# Patient Record
Sex: Male | Born: 1950 | Race: Black or African American | Hispanic: No | Marital: Single | Smoking: Never smoker
Health system: Southern US, Community
[De-identification: ages and names within clinical notes are randomized; demographics above are authoritative.]

## PROBLEM LIST (undated history)

## (undated) DIAGNOSIS — K219 Gastro-esophageal reflux disease without esophagitis: Secondary | ICD-10-CM

## (undated) DIAGNOSIS — I10 Essential (primary) hypertension: Secondary | ICD-10-CM

## (undated) DIAGNOSIS — M1990 Unspecified osteoarthritis, unspecified site: Secondary | ICD-10-CM

## (undated) DIAGNOSIS — E039 Hypothyroidism, unspecified: Secondary | ICD-10-CM

---

## 2015-06-26 HISTORY — PX: BICEPS TENDON REPAIR: SHX566

## 2017-05-16 NOTE — Progress Notes (Signed)
05-03-17 Surgical Clearance from Dr. Dorna LeitzMilam, EKG, CBC, CMP on chart

## 2017-05-17 ENCOUNTER — Other Ambulatory Visit: Payer: Self-pay

## 2017-05-17 ENCOUNTER — Encounter (HOSPITAL_COMMUNITY): Payer: Self-pay

## 2017-05-17 ENCOUNTER — Encounter (HOSPITAL_COMMUNITY)
Admission: RE | Admit: 2017-05-17 | Discharge: 2017-05-17 | Disposition: A | Discharge status: Home / Self Care | Payer: BLUE CROSS/BLUE SHIELD | Source: Ambulatory Visit | Attending: Orthopedic Surgery | Admitting: Orthopedic Surgery

## 2017-05-17 DIAGNOSIS — Z01818 Encounter for other preprocedural examination: Secondary | ICD-10-CM | POA: Insufficient documentation

## 2017-05-17 HISTORY — DX: Gastro-esophageal reflux disease without esophagitis: K21.9

## 2017-05-17 HISTORY — DX: Hypothyroidism, unspecified: E03.9

## 2017-05-17 HISTORY — DX: Unspecified osteoarthritis, unspecified site: M19.90

## 2017-05-17 HISTORY — DX: Essential (primary) hypertension: I10

## 2017-05-17 LAB — SURGICAL PCR SCREEN
MRSA, PCR: NEGATIVE
Staphylococcus aureus: NEGATIVE

## 2017-05-17 NOTE — H&P (Signed)
TOTAL KNEE ADMISSION H&P  Patient is being admitted for bilateral total knee arthroplasties.  Subjective:  Chief Complaint:    Bilateral knee primary OA / pain  HPI: Ethan Patrick., 67 y.o. male, has a history of pain and functional disability in the bilateral knee due to arthritis and has failed non-surgical conservative treatments for greater than 12 weeks to include NSAID's and/or analgesics, corticosteriod injections, viscosupplementation injections and activity modification.  Onset of symptoms was gradual, starting >10 years ago with gradually worsening course since that time. The patient noted no past surgery on the bilateral knees.  Patient currently rates pain in the bilateral knees at 8 out of 10 with activity. Patient has night pain, worsening of pain with activity and weight bearing, pain that interferes with activities of daily living, pain with passive range of motion, crepitus and joint swelling.  Patient has evidence of periarticular osteophytes and joint space narrowing by imaging studies.  There is no active infection.  Risks, benefits and expectations were discussed with the patient.  Risks including but not limited to the risk of anesthesia, blood clots, nerve damage, blood vessel damage, failure of the prosthesis, infection and up to and including death.  Patient understand the risks, benefits and expectations and wishes to proceed with surgery.   PCP: Zachery Dauer, MD  D/C Plans:       Home  Post-op Meds:       No Rx given  Tranexamic Acid:      To be given - IV   Decadron:      Is to be given  FYI:     Xarelto then ASA  Oxycodone  DME:   Rx given for - RW and 3-n-1  PT:   OPPT Rx given    Past Medical History:  Diagnosis Date  . Arthritis   . GERD (gastroesophageal reflux disease)   . Hypertension   . Hypothyroidism     Past Surgical History:  Procedure Laterality Date  . BICEPS TENDON REPAIR  06/26/2015    No current facility-administered  medications for this encounter.    Current Outpatient Medications  Medication Sig Dispense Refill Last Dose  . amLODipine (NORVASC) 2.5 MG tablet Take 2.5 mg by mouth daily.     Marland Kitchen aspirin EC 81 MG tablet Take 81 mg by mouth daily.     . Cholecalciferol (VITAMIN D-3) 5000 units TABS Take 5,000 Units by mouth daily.     . folic acid (FOLVITE) 1 MG tablet Take 1 mg by mouth daily.     . hydroxychloroquine (PLAQUENIL) 200 MG tablet Take 400 mg by mouth daily at 2 PM.     . levothyroxine (SYNTHROID, LEVOTHROID) 88 MCG tablet Take 88 mcg by mouth daily before breakfast.     . meloxicam (MOBIC) 15 MG tablet Take 15 mg by mouth daily at 2 PM.     . methotrexate (50 MG/ML) 1 g injection Inject 50 mg into the vein every Wednesday. In the morning.     . Omega-3 Fatty Acids (FISH OIL PO) Take 520 mg by mouth daily.     . pantoprazole (PROTONIX) 40 MG tablet Take 40 mg by mouth daily.     . vitamin B-12 (CYANOCOBALAMIN) 1000 MCG tablet Take 1,000 mcg by mouth daily.      No Known Allergies   Social History   Tobacco Use  . Smoking status: Never Smoker  . Smokeless tobacco: Never Used  Substance Use Topics  . Alcohol  use: No    Frequency: Never       Review of Systems  Constitutional: Negative.   HENT: Negative.   Eyes: Negative.   Respiratory: Negative.   Cardiovascular: Negative.   Gastrointestinal: Positive for heartburn.  Genitourinary: Negative.   Musculoskeletal: Positive for joint pain.  Skin: Negative.   Neurological: Negative.   Endo/Heme/Allergies: Negative.   Psychiatric/Behavioral: Negative.     Objective:  Physical Exam  Constitutional: He is oriented to person, place, and time. He appears well-developed.  HENT:  Head: Normocephalic.  Eyes: Pupils are equal, round, and reactive to light.  Neck: Neck supple. No JVD present. No tracheal deviation present. No thyromegaly present.  Cardiovascular: Normal rate, regular rhythm and intact distal pulses.  Respiratory:  Effort normal and breath sounds normal. No respiratory distress. He has no wheezes.  GI: Soft. There is no tenderness. There is no guarding.  Musculoskeletal:       Right knee: He exhibits decreased range of motion, swelling, abnormal alignment and bony tenderness. He exhibits no ecchymosis, no deformity, no laceration and no erythema. Tenderness found.       Left knee: He exhibits decreased range of motion, swelling, abnormal alignment and bony tenderness. He exhibits no ecchymosis, no deformity, no laceration and no erythema. Tenderness found.  Lymphadenopathy:    He has no cervical adenopathy.  Neurological: He is alert and oriented to person, place, and time.  Skin: Skin is warm and dry.  Psychiatric: He has a normal mood and affect.        Imaging Review Plain radiographs demonstrate severe degenerative joint disease of the bilateral knees. The overall alignment is varus. The bone quality appears to be good for age and reported activity level.  Assessment/Plan:  End stage arthritis, bilateral knees   The patient history, physical examination, clinical judgment of the provider and imaging studies are consistent with end stage degenerative joint disease of the bilateral knees and total knee arthroplasty is deemed medically necessary. The treatment options including medical management, injection therapy arthroscopy and arthroplasty were discussed at length. The risks and benefits of total knee arthroplasty were presented and reviewed. The risks due to aseptic loosening, infection, stiffness, patella tracking problems, thromboembolic complications and other imponderables were discussed. The patient acknowledged the explanation, agreed to proceed with the plan and consent was signed. Patient is being admitted for inpatient treatment for surgery, pain control, PT, OT, prophylactic antibiotics, VTE prophylaxis, progressive ambulation and ADL's and discharge planning. The patient is planning to  be discharged home.      Ethan AuerbachMatthew S. Eustolia Drennen   PA-C  05/20/2017, 4:15 PM

## 2017-05-17 NOTE — Patient Instructions (Signed)
Ethan Pineda.  05/17/2017   Your procedure is scheduled on: 05-27-17   Report to [facility] Main  Entrance Report to Admitting at 7:15 AM   Call this number if you have problems the morning of surgery [phone]   Remember: Do not eat food or drink liquids :After Midnight.     Take these medicines the morning of surgery with A SIP OF WATER: Levothyroxine (Synthroid), and Pantoprazole (Protonix)                                You may not have any metal on your body including hair pins and              piercings  Do not wear jewelry, lotions, powders or deodorant             Men may shave face and neck.   Do not bring valuables to the hospital. [facility] IS NOT             RESPONSIBLE   FOR VALUABLES.  Contacts, dentures or bridgework may not be worn into surgery.  Leave suitcase in the car. After surgery it may be brought to your room.                 Please read over the following fact sheets you were given: _____________________________________________________________________           [facility] - Preparing for Surgery Before surgery, you can play an important role.  Because skin is not sterile, your skin needs to be as free of germs as possible.  You can reduce the number of germs on your skin by washing with CHG (chlorahexidine gluconate) soap before surgery.  CHG is an antiseptic cleaner which kills germs and bonds with the skin to continue killing germs even after washing. Please DO NOT use if you have an allergy to CHG or antibacterial soaps.  If your skin becomes reddened/irritated stop using the CHG and inform your nurse when you arrive at Short Stay. Do not shave (including legs and underarms) for at least 48 hours prior to the first CHG shower.  You may shave your face/neck. Please follow these instructions carefully:  1.  Shower with CHG Soap the night before surgery and the  morning of Surgery.  2.  If you choose to wash your hair,  wash your hair first as usual with your  normal  shampoo.  3.  After you shampoo, rinse your hair and body thoroughly to remove the  shampoo.                           4.  Use CHG as you would any other liquid soap.  You can apply chg directly  to the skin and wash                       Gently with a scrungie or clean washcloth.  5.  Apply the CHG Soap to your body ONLY FROM THE NECK DOWN.   Do not use on face/ open                           Wound or open sores. Avoid contact with eyes, ears mouth and genitals (  private parts).                       Wash face,  Genitals (private parts) with your normal soap.             6.  Wash thoroughly, paying special attention to the area where your surgery  will be performed.  7.  Thoroughly rinse your body with warm water from the neck down.  8.  DO NOT shower/wash with your normal soap after using and rinsing off  the CHG Soap.                9.  Pat yourself dry with a clean towel.            10.  Wear clean pajamas.            11.  Place clean sheets on your bed the night of your first shower and do not  sleep with pets. Day of Surgery : Do not apply any lotions/deodorants the morning of surgery.  Please wear clean clothes to the hospital/surgery center.  FAILURE TO FOLLOW THESE INSTRUCTIONS MAY RESULT IN THE CANCELLATION OF YOUR SURGERY PATIENT SIGNATURE_________________________________  NURSE SIGNATURE__________________________________  ________________________________________________________________________   Ethan Pineda  An incentive spirometer is a tool that can help keep your lungs clear and active. This tool measures how well you are filling your lungs with each breath. Taking long deep breaths may help reverse or decrease the chance of developing breathing (pulmonary) problems (especially infection) following:  A long period of time when you are unable to move or be active. BEFORE THE PROCEDURE   If the spirometer includes an  indicator to show your best effort, your nurse or respiratory therapist will set it to a desired goal.  If possible, sit up straight or lean slightly forward. Try not to slouch.  Hold the incentive spirometer in an upright position. INSTRUCTIONS FOR USE  1. Sit on the edge of your bed if possible, or sit up as far as you can in bed or on a chair. 2. Hold the incentive spirometer in an upright position. 3. Breathe out normally. 4. Place the mouthpiece in your mouth and seal your lips tightly around it. 5. Breathe in slowly and as deeply as possible, raising the piston or the ball toward the top of the column. 6. Hold your breath for 3-5 seconds or for as long as possible. Allow the piston or ball to fall to the bottom of the column. 7. Remove the mouthpiece from your mouth and breathe out normally. 8. Rest for a few seconds and repeat Steps 1 through 7 at least 10 times every 1-2 hours when you are awake. Take your time and take a few normal breaths between deep breaths. 9. The spirometer may include an indicator to show your best effort. Use the indicator as a goal to work toward during each repetition. 10. After each set of 10 deep breaths, practice coughing to be sure your lungs are clear. If you have an incision (the cut made at the time of surgery), support your incision when coughing by placing a pillow or rolled up towels firmly against it. Once you are able to get out of bed, walk around indoors and cough well. You may stop using the incentive spirometer when instructed by your caregiver.  RISKS AND COMPLICATIONS  Take your time so you do not get dizzy or light-headed.  If you are in pain, you may need  to take or ask for pain medication before doing incentive spirometry. It is harder to take a deep breath if you are having pain. AFTER USE  Rest and breathe slowly and easily.  It can be helpful to keep track of a log of your progress. Your caregiver can provide you with a simple table  to help with this. If you are using the spirometer at home, follow these instructions: SEEK MEDICAL CARE IF:   You are having difficultly using the spirometer.  You have trouble using the spirometer as often as instructed.  Your pain medication is not giving enough relief while using the spirometer.  You develop fever of 100.5 F (38.1 C) or higher. SEEK IMMEDIATE MEDICAL CARE IF:   You cough up bloody sputum that had not been present before.  You develop fever of 102 F (38.9 C) or greater.  You develop worsening pain at or near the incision site. MAKE SURE YOU:   Understand these instructions.  Will watch your condition.  Will get help right away if you are not doing well or get worse. Document Released: 07/30/2006 Document Revised: 06/11/2011 Document Reviewed: 09/30/2006 Exit[employer] Patient Information 2014 [employer], [place].   ________________________________________________________________________  WHAT IS A BLOOD TRANSFUSION? Blood Transfusion Information  A transfusion is the replacement of blood or some of its parts. Blood is made up of multiple cells which provide different functions.  Red blood cells carry oxygen and are used for blood loss replacement.  White blood cells fight against infection.  Platelets control bleeding.  Plasma helps clot blood.  Other blood products are available for specialized needs, such as hemophilia or other clotting disorders. BEFORE THE TRANSFUSION  Who gives blood for transfusions?   Healthy volunteers who are fully evaluated to make sure their blood is safe. This is blood bank blood. Transfusion therapy is the safest it has ever been in the practice of medicine. Before blood is taken from a donor, a complete history is taken to make sure that person has no history of diseases nor engages in risky social behavior (examples are intravenous drug use or sexual activity with multiple partners). The donor's travel history is screened  to minimize risk of transmitting infections, such as malaria. The donated blood is tested for signs of infectious diseases, such as HIV and hepatitis. The blood is then tested to be sure it is compatible with you in order to minimize the chance of a transfusion reaction. If you or a relative donates blood, this is often done in anticipation of surgery and is not appropriate for emergency situations. It takes many days to process the donated blood. RISKS AND COMPLICATIONS Although transfusion therapy is very safe and saves many lives, the main dangers of transfusion include:   Getting an infectious disease.  Developing a transfusion reaction. This is an allergic reaction to something in the blood you were given. Every precaution is taken to prevent this. The decision to have a blood transfusion has been considered carefully by your caregiver before blood is given. Blood is not given unless the benefits outweigh the risks. AFTER THE TRANSFUSION  Right after receiving a blood transfusion, you will usually feel much better and more energetic. This is especially true if your red blood cells have gotten low (anemic). The transfusion raises the level of the red blood cells which carry oxygen, and this usually causes an energy increase.  The nurse administering the transfusion will monitor you carefully for complications. HOME CARE INSTRUCTIONS  No special instructions  are needed after a transfusion. You may find your energy is better. Speak with your caregiver about any limitations on activity for underlying diseases you may have. SEEK MEDICAL CARE IF:   Your condition is not improving after your transfusion.  You develop redness or irritation at the intravenous (IV) site. SEEK IMMEDIATE MEDICAL CARE IF:  Any of the following symptoms occur over the next 12 hours:  Shaking chills.  You have a temperature by mouth above 102 F (38.9 C), not controlled by medicine.  Chest, back, or muscle  pain.  People around you feel you are not acting correctly or are confused.  Shortness of breath or difficulty breathing.  Dizziness and fainting.  You get a rash or develop hives.  You have a decrease in urine output.  Your urine turns a dark color or changes to pink, red, or brown. Any of the following symptoms occur over the next 10 days:  You have a temperature by mouth above 102 F (38.9 C), not controlled by medicine.  Shortness of breath.  Weakness after normal activity.  The white part of the eye turns yellow (jaundice).  You have a decrease in the amount of urine or are urinating less often.  Your urine turns a dark color or changes to pink, red, or brown. Document Released: 03/16/2000 Document Revised: 06/11/2011 Document Reviewed: 11/03/2007 [facility] Patient Information 2014 [place] [facility] [place], [place].  _______________________________________________________________________

## 2017-05-18 LAB — ABO/RH: ABO/RH(D): B POS

## 2017-05-27 ENCOUNTER — Inpatient Hospital Stay (HOSPITAL_COMMUNITY): Payer: BLUE CROSS/BLUE SHIELD | Admitting: Anesthesiology

## 2017-05-27 ENCOUNTER — Other Ambulatory Visit: Payer: Self-pay

## 2017-05-27 ENCOUNTER — Encounter (HOSPITAL_COMMUNITY)
Admission: RE | Disposition: A | Discharge status: Home / Self Care | Payer: Self-pay | Source: Ambulatory Visit | Attending: Orthopedic Surgery

## 2017-05-27 ENCOUNTER — Inpatient Hospital Stay (HOSPITAL_COMMUNITY)
Admission: RE | Admit: 2017-05-27 | Discharge: 2017-05-29 | DRG: 462 | Disposition: A | Discharge status: Home / Self Care | Payer: BLUE CROSS/BLUE SHIELD | Source: Ambulatory Visit | Attending: Orthopedic Surgery | Admitting: Orthopedic Surgery

## 2017-05-27 ENCOUNTER — Encounter (HOSPITAL_COMMUNITY): Payer: Self-pay | Admitting: Certified Registered Nurse Anesthetist

## 2017-05-27 DIAGNOSIS — M170 Bilateral primary osteoarthritis of knee: Secondary | ICD-10-CM | POA: Diagnosis present

## 2017-05-27 DIAGNOSIS — I10 Essential (primary) hypertension: Secondary | ICD-10-CM | POA: Diagnosis present

## 2017-05-27 DIAGNOSIS — Z7982 Long term (current) use of aspirin: Secondary | ICD-10-CM | POA: Diagnosis not present

## 2017-05-27 DIAGNOSIS — K219 Gastro-esophageal reflux disease without esophagitis: Secondary | ICD-10-CM | POA: Diagnosis present

## 2017-05-27 DIAGNOSIS — Z791 Long term (current) use of non-steroidal anti-inflammatories (NSAID): Secondary | ICD-10-CM | POA: Diagnosis not present

## 2017-05-27 DIAGNOSIS — E039 Hypothyroidism, unspecified: Secondary | ICD-10-CM | POA: Diagnosis present

## 2017-05-27 DIAGNOSIS — Z79899 Other long term (current) drug therapy: Secondary | ICD-10-CM

## 2017-05-27 DIAGNOSIS — Z96653 Presence of artificial knee joint, bilateral: Secondary | ICD-10-CM

## 2017-05-27 HISTORY — PX: TOTAL KNEE ARTHROPLASTY: SHX125

## 2017-05-27 LAB — TYPE AND SCREEN
ABO/RH(D): B POS
ANTIBODY SCREEN: NEGATIVE

## 2017-05-27 SURGERY — ARTHROPLASTY, KNEE, BILATERAL, TOTAL
Anesthesia: Monitor Anesthesia Care | Laterality: Bilateral

## 2017-05-27 MED ORDER — METHOCARBAMOL 1000 MG/10ML IJ SOLN
500.0000 mg | Freq: Four times a day (QID) | INTRAVENOUS | Status: DC | PRN
  Administered 2017-05-27: 500 mg via INTRAVENOUS
  Filled 2017-05-27: qty 550

## 2017-05-27 MED ORDER — SODIUM CHLORIDE 0.9 % IJ SOLN
INTRAMUSCULAR | Status: DC | PRN
  Administered 2017-05-27 (×2): 15 mL

## 2017-05-27 MED ORDER — PANTOPRAZOLE SODIUM 40 MG PO TBEC
40.0000 mg | DELAYED_RELEASE_TABLET | Freq: Every day | ORAL | Status: DC
  Administered 2017-05-28 – 2017-05-29 (×2): 40 mg via ORAL
  Filled 2017-05-27 (×2): qty 1

## 2017-05-27 MED ORDER — AMLODIPINE BESYLATE 2.5 MG PO TABS
2.5000 mg | ORAL_TABLET | Freq: Every day | ORAL | Status: DC
  Administered 2017-05-28 – 2017-05-29 (×2): 2.5 mg via ORAL
  Filled 2017-05-27 (×2): qty 1

## 2017-05-27 MED ORDER — TRANEXAMIC ACID 1000 MG/10ML IV SOLN
1000.0000 mg | Freq: Once | INTRAVENOUS | Status: AC
  Administered 2017-05-27: 1000 mg via INTRAVENOUS
  Filled 2017-05-27: qty 1010

## 2017-05-27 MED ORDER — MIDAZOLAM HCL 2 MG/2ML IJ SOLN
INTRAMUSCULAR | Status: AC
  Filled 2017-05-27: qty 2

## 2017-05-27 MED ORDER — METHOCARBAMOL 500 MG PO TABS
500.0000 mg | ORAL_TABLET | Freq: Four times a day (QID) | ORAL | Status: DC | PRN
  Administered 2017-05-27 – 2017-05-29 (×4): 500 mg via ORAL
  Filled 2017-05-27 (×3): qty 1

## 2017-05-27 MED ORDER — RIVAROXABAN 10 MG PO TABS
10.0000 mg | ORAL_TABLET | ORAL | Status: DC
  Administered 2017-05-28 – 2017-05-29 (×2): 10 mg via ORAL
  Filled 2017-05-27 (×2): qty 1

## 2017-05-27 MED ORDER — SODIUM CHLORIDE 0.9 % IR SOLN
Status: DC | PRN
  Administered 2017-05-27: 1000 mL

## 2017-05-27 MED ORDER — ONDANSETRON HCL 4 MG PO TABS
4.0000 mg | ORAL_TABLET | Freq: Three times a day (TID) | ORAL | Status: DC | PRN
Start: 2017-05-27 — End: 2017-05-29
  Filled 2017-05-27: qty 1

## 2017-05-27 MED ORDER — SODIUM CHLORIDE 0.9 % IJ SOLN
INTRAMUSCULAR | Status: AC
  Filled 2017-05-27: qty 50

## 2017-05-27 MED ORDER — PHENOL 1.4 % MT LIQD
1.0000 | OROMUCOSAL | Status: DC | PRN
  Filled 2017-05-27: qty 177

## 2017-05-27 MED ORDER — MENTHOL 3 MG MT LOZG
1.0000 | LOZENGE | OROMUCOSAL | Status: DC | PRN
  Filled 2017-05-27: qty 9

## 2017-05-27 MED ORDER — SODIUM CHLORIDE 0.9 % IV SOLN
INTRAVENOUS | Status: DC
  Administered 2017-05-27: 23:00:00 via INTRAVENOUS

## 2017-05-27 MED ORDER — BISACODYL 10 MG RE SUPP
10.0000 mg | Freq: Every day | RECTAL | Status: DC | PRN
  Filled 2017-05-27: qty 1

## 2017-05-27 MED ORDER — OXYCODONE HCL 5 MG PO TABS: 5.0000 mg | ORAL_TABLET | ORAL | 0 refills | Status: DC | PRN

## 2017-05-27 MED ORDER — ROPIVACAINE HCL 7.5 MG/ML IJ SOLN
INTRAMUSCULAR | Status: DC | PRN
  Administered 2017-05-27 (×2): 20 mL via PERINEURAL

## 2017-05-27 MED ORDER — ALUM & MAG HYDROXIDE-SIMETH 200-200-20 MG/5ML PO SUSP
15.0000 mL | ORAL | Status: DC | PRN
Start: 2017-05-27 — End: 2017-05-29
  Filled 2017-05-27: qty 30

## 2017-05-27 MED ORDER — DEXAMETHASONE SODIUM PHOSPHATE 10 MG/ML IJ SOLN
10.0000 mg | Freq: Once | INTRAMUSCULAR | Status: AC
  Administered 2017-05-28: 11:00:00 10 mg via INTRAVENOUS
  Filled 2017-05-27: qty 1

## 2017-05-27 MED ORDER — METOCLOPRAMIDE HCL 5 MG PO TABS
5.0000 mg | ORAL_TABLET | Freq: Three times a day (TID) | ORAL | Status: DC | PRN
  Filled 2017-05-27: qty 2

## 2017-05-27 MED ORDER — DEXTROSE 5 % IV SOLN
INTRAVENOUS | Status: DC | PRN
  Administered 2017-05-27: 25 ug/min via INTRAVENOUS

## 2017-05-27 MED ORDER — PROPOFOL 500 MG/50ML IV EMUL
INTRAVENOUS | Status: DC | PRN
  Administered 2017-05-27: 50 ug/kg/min via INTRAVENOUS

## 2017-05-27 MED ORDER — LEVOTHYROXINE SODIUM 88 MCG PO TABS
88.0000 ug | ORAL_TABLET | Freq: Every day | ORAL | Status: DC
Start: 2017-05-28 — End: 2017-05-29
  Administered 2017-05-28 – 2017-05-29 (×2): 88 ug via ORAL
  Filled 2017-05-27 (×2): qty 1

## 2017-05-27 MED ORDER — HYDROMORPHONE HCL 1 MG/ML IJ SOLN: 0.2500 mg | INTRAMUSCULAR | Status: DC | PRN

## 2017-05-27 MED ORDER — CEFAZOLIN SODIUM-DEXTROSE 2-4 GM/100ML-% IV SOLN
2.0000 g | INTRAVENOUS | Status: AC
  Administered 2017-05-27: 2 g via INTRAVENOUS
  Filled 2017-05-27: qty 100

## 2017-05-27 MED ORDER — METHOCARBAMOL 500 MG PO TABS: 500.0000 mg | ORAL_TABLET | Freq: Four times a day (QID) | ORAL | 0 refills | Status: AC | PRN

## 2017-05-27 MED ORDER — FERROUS SULFATE 325 (65 FE) MG PO TABS: 325.0000 mg | ORAL_TABLET | Freq: Three times a day (TID) | ORAL | 3 refills | Status: AC

## 2017-05-27 MED ORDER — MAGNESIUM CITRATE PO SOLN
1.0000 | Freq: Once | ORAL | Status: DC | PRN
  Filled 2017-05-27: qty 296

## 2017-05-27 MED ORDER — CEFAZOLIN SODIUM-DEXTROSE 2-4 GM/100ML-% IV SOLN
2.0000 g | Freq: Four times a day (QID) | INTRAVENOUS | Status: AC
  Administered 2017-05-27 (×2): 2 g via INTRAVENOUS
  Filled 2017-05-27: qty 100

## 2017-05-27 MED ORDER — ACETAMINOPHEN 500 MG PO TABS
1000.0000 mg | ORAL_TABLET | Freq: Three times a day (TID) | ORAL | Status: DC
  Administered 2017-05-27 – 2017-05-29 (×5): 1000 mg via ORAL
  Filled 2017-05-27 (×5): qty 2

## 2017-05-27 MED ORDER — OXYCODONE HCL 5 MG PO TABS
ORAL_TABLET | ORAL | Status: AC
  Filled 2017-05-27: qty 1

## 2017-05-27 MED ORDER — DOCUSATE SODIUM 100 MG PO CAPS: 100.0000 mg | ORAL_CAPSULE | Freq: Two times a day (BID) | ORAL | 0 refills | Status: AC

## 2017-05-27 MED ORDER — RIVAROXABAN 10 MG PO TABS: 10.0000 mg | ORAL_TABLET | Freq: Every day | ORAL | 0 refills | Status: AC

## 2017-05-27 MED ORDER — PROPOFOL 10 MG/ML IV BOLUS
INTRAVENOUS | Status: AC
  Filled 2017-05-27: qty 40

## 2017-05-27 MED ORDER — PROPOFOL 10 MG/ML IV BOLUS
INTRAVENOUS | Status: AC
  Filled 2017-05-27: qty 20

## 2017-05-27 MED ORDER — CELECOXIB 200 MG PO CAPS
200.0000 mg | ORAL_CAPSULE | Freq: Two times a day (BID) | ORAL | Status: DC
  Administered 2017-05-27 – 2017-05-29 (×4): 200 mg via ORAL
  Filled 2017-05-27 (×4): qty 1

## 2017-05-27 MED ORDER — MIDAZOLAM HCL 5 MG/5ML IJ SOLN
INTRAMUSCULAR | Status: DC | PRN
  Administered 2017-05-27: 2 mg via INTRAVENOUS

## 2017-05-27 MED ORDER — CHLORHEXIDINE GLUCONATE 4 % EX LIQD: 60.0000 mL | Freq: Once | CUTANEOUS | Status: DC

## 2017-05-27 MED ORDER — METHOCARBAMOL 500 MG PO TABS
ORAL_TABLET | ORAL | Status: AC
  Filled 2017-05-27: qty 1

## 2017-05-27 MED ORDER — ASPIRIN 81 MG PO CHEW: 81.0000 mg | CHEWABLE_TABLET | Freq: Two times a day (BID) | ORAL | 0 refills | Status: AC

## 2017-05-27 MED ORDER — ACETAMINOPHEN 160 MG/5ML PO SOLN: 325.0000 mg | ORAL | Status: DC | PRN

## 2017-05-27 MED ORDER — DIPHENHYDRAMINE HCL 12.5 MG/5ML PO ELIX
12.5000 mg | ORAL_SOLUTION | ORAL | Status: DC | PRN
  Filled 2017-05-27: qty 10

## 2017-05-27 MED ORDER — BUPIVACAINE HCL (PF) 0.5 % IJ SOLN
INTRAMUSCULAR | Status: DC | PRN
  Administered 2017-05-27: 3 mL

## 2017-05-27 MED ORDER — PHENYLEPHRINE HCL 10 MG/ML IJ SOLN
INTRAMUSCULAR | Status: AC
  Filled 2017-05-27: qty 1

## 2017-05-27 MED ORDER — OXYCODONE HCL 5 MG PO TABS
10.0000 mg | ORAL_TABLET | ORAL | Status: DC | PRN
  Administered 2017-05-27 – 2017-05-29 (×9): 10 mg via ORAL
  Filled 2017-05-27 (×9): qty 2

## 2017-05-27 MED ORDER — BUPIVACAINE-EPINEPHRINE (PF) 0.25% -1:200000 IJ SOLN
INTRAMUSCULAR | Status: DC | PRN
  Administered 2017-05-27 (×2): 15 mL

## 2017-05-27 MED ORDER — KETOROLAC TROMETHAMINE 30 MG/ML IJ SOLN
INTRAMUSCULAR | Status: AC
  Filled 2017-05-27: qty 1

## 2017-05-27 MED ORDER — ACETAMINOPHEN 500 MG PO TABS: 1000.0000 mg | ORAL_TABLET | Freq: Three times a day (TID) | ORAL | 0 refills | Status: AC

## 2017-05-27 MED ORDER — POLYETHYLENE GLYCOL 3350 17 G PO PACK: 17.0000 g | PACK | Freq: Two times a day (BID) | ORAL | 0 refills | Status: AC

## 2017-05-27 MED ORDER — FENTANYL CITRATE (PF) 100 MCG/2ML IJ SOLN
INTRAMUSCULAR | Status: DC | PRN
  Administered 2017-05-27 (×2): 50 ug via INTRAVENOUS

## 2017-05-27 MED ORDER — LACTATED RINGERS IV SOLN
INTRAVENOUS | Status: DC
  Administered 2017-05-27 (×3): via INTRAVENOUS

## 2017-05-27 MED ORDER — POLYETHYLENE GLYCOL 3350 17 G PO PACK
17.0000 g | PACK | Freq: Two times a day (BID) | ORAL | Status: DC
  Administered 2017-05-27 – 2017-05-29 (×4): 17 g via ORAL
  Filled 2017-05-27 (×4): qty 1

## 2017-05-27 MED ORDER — HYDROMORPHONE HCL 1 MG/ML IJ SOLN
0.5000 mg | INTRAMUSCULAR | Status: DC | PRN
  Administered 2017-05-28: 10:00:00 0.5 mg via INTRAVENOUS
  Filled 2017-05-27: qty 1

## 2017-05-27 MED ORDER — ACETAMINOPHEN 325 MG PO TABS: 325.0000 mg | ORAL_TABLET | ORAL | Status: DC | PRN

## 2017-05-27 MED ORDER — SODIUM CHLORIDE 0.9 % IR SOLN
Status: DC | PRN
  Administered 2017-05-27 (×2): 1000 mL

## 2017-05-27 MED ORDER — OXYCODONE HCL 5 MG PO TABS: 5.0000 mg | ORAL_TABLET | Freq: Once | ORAL | Status: DC | PRN

## 2017-05-27 MED ORDER — TRANEXAMIC ACID 1000 MG/10ML IV SOLN
1000.0000 mg | INTRAVENOUS | Status: AC
  Administered 2017-05-27: 1000 mg via INTRAVENOUS
  Filled 2017-05-27: qty 1100

## 2017-05-27 MED ORDER — FERROUS SULFATE 325 (65 FE) MG PO TABS
325.0000 mg | ORAL_TABLET | Freq: Three times a day (TID) | ORAL | Status: DC
  Administered 2017-05-28 – 2017-05-29 (×5): 325 mg via ORAL
  Filled 2017-05-27 (×6): qty 1

## 2017-05-27 MED ORDER — DOCUSATE SODIUM 100 MG PO CAPS
100.0000 mg | ORAL_CAPSULE | Freq: Two times a day (BID) | ORAL | Status: DC
  Administered 2017-05-27 – 2017-05-29 (×4): 100 mg via ORAL
  Filled 2017-05-27 (×4): qty 1

## 2017-05-27 MED ORDER — BUPIVACAINE-EPINEPHRINE 0.25% -1:200000 IJ SOLN
INTRAMUSCULAR | Status: AC
  Filled 2017-05-27: qty 1

## 2017-05-27 MED ORDER — CEFAZOLIN SODIUM-DEXTROSE 2-4 GM/100ML-% IV SOLN
INTRAVENOUS | Status: AC
  Filled 2017-05-27: qty 100

## 2017-05-27 MED ORDER — OXYCODONE HCL 5 MG/5ML PO SOLN
5.0000 mg | Freq: Once | ORAL | Status: DC | PRN
  Filled 2017-05-27: qty 5

## 2017-05-27 MED ORDER — OXYCODONE HCL 5 MG PO TABS
5.0000 mg | ORAL_TABLET | ORAL | Status: DC | PRN
  Administered 2017-05-27: 5 mg via ORAL

## 2017-05-27 MED ORDER — KETOROLAC TROMETHAMINE 30 MG/ML IJ SOLN
INTRAMUSCULAR | Status: DC | PRN
  Administered 2017-05-27 (×2): 15 mg

## 2017-05-27 MED ORDER — ONDANSETRON HCL 4 MG/2ML IJ SOLN: 4.0000 mg | Freq: Three times a day (TID) | INTRAMUSCULAR | Status: DC | PRN

## 2017-05-27 MED ORDER — DEXAMETHASONE SODIUM PHOSPHATE 10 MG/ML IJ SOLN
10.0000 mg | Freq: Once | INTRAMUSCULAR | Status: AC
  Administered 2017-05-27: 10 mg via INTRAVENOUS

## 2017-05-27 MED ORDER — FENTANYL CITRATE (PF) 100 MCG/2ML IJ SOLN
INTRAMUSCULAR | Status: AC
  Filled 2017-05-27: qty 2

## 2017-05-27 MED ORDER — METOCLOPRAMIDE HCL 5 MG/ML IJ SOLN: 5.0000 mg | Freq: Three times a day (TID) | INTRAMUSCULAR | Status: DC | PRN

## 2017-05-27 SURGICAL SUPPLY — 56 items
BAG ZIPLOCK 12X15 (MISCELLANEOUS) IMPLANT
BANDAGE ACE 6X5 VEL STRL LF (GAUZE/BANDAGES/DRESSINGS) ×6 IMPLANT
BANDAGE ESMARK 6X9 LF (GAUZE/BANDAGES/DRESSINGS) ×1 IMPLANT
BLADE SAW SGTL 13.0X1.19X90.0M (BLADE) ×6 IMPLANT
BLADE SURG SZ10 CARB STEEL (BLADE) ×6 IMPLANT
BNDG COHESIVE 6X5 TAN STRL LF (GAUZE/BANDAGES/DRESSINGS) ×3 IMPLANT
BNDG ESMARK 6X9 LF (GAUZE/BANDAGES/DRESSINGS) ×3
BOWL SMART MIX CTS (DISPOSABLE) ×6 IMPLANT
CAPT KNEE TOTAL 3 ATTUNE ×6 IMPLANT
CEMENT HV SMART SET (Cement) ×12 IMPLANT
COVER SURGICAL LIGHT HANDLE (MISCELLANEOUS) ×3 IMPLANT
CUFF TOURN SGL QUICK 34 (TOURNIQUET CUFF) ×4
CUFF TRNQT CYL 34X4X40X1 (TOURNIQUET CUFF) ×2 IMPLANT
DECANTER SPIKE VIAL GLASS SM (MISCELLANEOUS) ×3 IMPLANT
DERMABOND ADVANCED (GAUZE/BANDAGES/DRESSINGS) ×4
DERMABOND ADVANCED .7 DNX12 (GAUZE/BANDAGES/DRESSINGS) ×2 IMPLANT
DRAPE EXTREMITY BILATERAL (DRAPES) ×3 IMPLANT
DRAPE INCISE IOBAN 66X45 STRL (DRAPES) ×3 IMPLANT
DRAPE U-SHAPE 47X51 STRL (DRAPES) ×12 IMPLANT
DRESSING AQUACEL AG SP 3.5X10 (GAUZE/BANDAGES/DRESSINGS) ×2 IMPLANT
DRSG AQUACEL AG SP 3.5X10 (GAUZE/BANDAGES/DRESSINGS) ×6
DURAPREP 26ML APPLICATOR (WOUND CARE) ×6 IMPLANT
ELECT CAUTERY BLADE 6.4 (BLADE) ×3 IMPLANT
ELECT CAUTERY BLADE TIP 2.5 (TIP) ×3
ELECT REM PT RETURN 15FT ADLT (MISCELLANEOUS) ×3 IMPLANT
ELECTRODE CAUTERY BLDE TIP 2.5 (TIP) ×1 IMPLANT
FACESHIELD WRAPAROUND (MASK) ×9 IMPLANT
GLOVE BIOGEL M 7.0 STRL (GLOVE) IMPLANT
GLOVE BIOGEL PI IND STRL 7.0 (GLOVE) ×6 IMPLANT
GLOVE BIOGEL PI IND STRL 7.5 (GLOVE) ×1 IMPLANT
GLOVE BIOGEL PI IND STRL 8.5 (GLOVE) ×1 IMPLANT
GLOVE BIOGEL PI INDICATOR 7.0 (GLOVE) ×12
GLOVE BIOGEL PI INDICATOR 7.5 (GLOVE) ×2
GLOVE BIOGEL PI INDICATOR 8.5 (GLOVE) ×2
GLOVE ECLIPSE 8.0 STRL XLNG CF (GLOVE) ×9 IMPLANT
GLOVE ORTHO TXT STRL SZ7.5 (GLOVE) ×9 IMPLANT
GOWN STRL REUS W/TWL LRG LVL3 (GOWN DISPOSABLE) ×9 IMPLANT
GOWN STRL REUS W/TWL XL LVL3 (GOWN DISPOSABLE) ×6 IMPLANT
HANDPIECE INTERPULSE COAX TIP (DISPOSABLE) ×2
MANIFOLD NEPTUNE II (INSTRUMENTS) ×3 IMPLANT
NS IRRIG 1000ML POUR BTL (IV SOLUTION) ×3 IMPLANT
PACK TOTAL KNEE CUSTOM (KITS) ×3 IMPLANT
SET HNDPC FAN SPRY TIP SCT (DISPOSABLE) ×1 IMPLANT
SET PAD KNEE POSITIONER (MISCELLANEOUS) ×6 IMPLANT
SPONGE LAP 18X18 X RAY DECT (DISPOSABLE) ×6 IMPLANT
STOCKINETTE 8 INCH (MISCELLANEOUS) ×3 IMPLANT
SUT MNCRL AB 4-0 PS2 18 (SUTURE) ×6 IMPLANT
SUT VIC AB 1 CT1 36 (SUTURE) ×6 IMPLANT
SUT VIC AB 2-0 CT1 27 (SUTURE) ×8
SUT VIC AB 2-0 CT1 TAPERPNT 27 (SUTURE) ×4 IMPLANT
SUT VLOC 180 0 24IN GS25 (SUTURE) ×6 IMPLANT
SYRINGE 60CC LL (MISCELLANEOUS) ×3 IMPLANT
TRAY FOLEY W/METER SILVER 16FR (SET/KITS/TRAYS/PACK) ×3 IMPLANT
WATER STERILE IRR 1000ML POUR (IV SOLUTION) ×6 IMPLANT
WRAP KNEE MAXI GEL POST OP (GAUZE/BANDAGES/DRESSINGS) ×6 IMPLANT
YANKAUER SUCT BULB TIP 10FT TU (MISCELLANEOUS) IMPLANT

## 2017-05-27 NOTE — Anesthesia Procedure Notes (Signed)
Anesthesia Regional Block: Adductor canal block   Pre-Anesthetic Checklist: ,, timeout performed, Correct Patient, Correct Site, Correct Laterality, Correct Procedure, Correct Position, site marked, Risks and benefits discussed,  Surgical consent,  Pre-op evaluation,  At surgeon's request and post-op pain management  Laterality: Lower and Right  Prep: chloraprep       Needles:  Injection technique: Single-shot  Needle Type: Echogenic Stimulator Needle          Additional Needles:   Procedures:,,,, ultrasound used (permanent image in chart),,,,  Narrative:  Start time: 05/27/2017 1:06 PM End time: 05/27/2017 1:13 PM Injection made incrementally with aspirations every 5 mL.  Performed by: Personally  Anesthesiologist: Val EagleMoser, Hula Tasso, MD  Additional Notes: H+P and labs reviewed, risks and benefits discussed with patient, procedure tolerated well without complications

## 2017-05-27 NOTE — Anesthesia Procedure Notes (Signed)
Spinal  Start time: 05/27/2017 10:00 AM End time: 05/27/2017 10:04 AM Staffing Resident/CRNA: Kizzie Fantasiaarver, Shanikia Kernodle J, CRNA Performed: resident/CRNA  Preanesthetic Checklist Completed: patient identified, site marked, surgical consent, pre-op evaluation, timeout performed, IV checked, risks and benefits discussed and monitors and equipment checked Spinal Block Patient position: sitting Prep: Betadine Patient monitoring: heart rate, continuous pulse ox and blood pressure Approach: midline Location: L3-4 Injection technique: single-shot Needle Needle type: Quincke  Needle length: 9 cm Needle insertion depth: 7 cm Additional Notes Pt sitting position, sterile prep/drape, negative paresthesia/heme.

## 2017-05-27 NOTE — Transfer of Care (Signed)
Immediate Anesthesia Transfer of Care Note  Patient: Ethan PatrickRaymond W Gault Jr.  Procedure(s) Performed: TOTAL KNEE BILATERAL (Bilateral )  Patient Location: PACU  Anesthesia Type:Spinal  Level of Consciousness: awake, alert  and oriented  Airway & Oxygen Therapy: Patient Spontanous Breathing and Patient connected to face mask oxygen  Post-op Assessment: Report given to RN and Post -op Vital signs reviewed and stable  Post vital signs: Reviewed and stable  Last Vitals:  Vitals:   05/27/17 0932  BP: 139/88  Pulse: 67  Resp: 16  Temp: 36.7 C  SpO2: 100%    Last Pain:  Vitals:   05/27/17 0932  TempSrc: Oral         Complications: No apparent anesthesia complications

## 2017-05-27 NOTE — Interval H&P Note (Signed)
History and Physical Interval Note:  05/27/2017 9:26 AM  Ethan Patrickaymond W Yoshimura Jr.  has presented today for surgery, with the diagnosis of Bilateral knee osteoarthrtitis  The various methods of treatment have been discussed with the patient and family. After consideration of risks, benefits and other options for treatment, the patient has consented to  Procedure(s) with comments: TOTAL KNEE BILATERAL (Bilateral) - 2.5 hrs as a surgical intervention .  The patient's history has been reviewed, patient examined, no change in status, stable for surgery.  I have reviewed the patient's chart and labs.  Questions were answered to the patient's satisfaction.     Shelda PalMatthew D Versie Fleener

## 2017-05-27 NOTE — Op Note (Signed)
NAME:  Ethan Pineda.                      MEDICAL RECORD NO.:  [id number]                             FACILITY:  [facility]      PHYSICIAN:  Madlyn Frankel. Charlann Boxer, M.D.  DATE OF BIRTH:  1950-04-18      DATE OF PROCEDURE:  05/27/2017     This was a scheduled simultaneously performed bilateral total knee replacement procedure.  We began on the left knee and then transition to his right knee.  Below are templated operative notes documenting standard procedure and sizes of components.  There were no complications during the procedure.  Both knees were performed in routine fashion.                                 OPERATIVE REPORT         PREOPERATIVE DIAGNOSIS:  Left knee osteoarthritis.      POSTOPERATIVE DIAGNOSIS:  Left knee osteoarthritis.      FINDINGS:  The patient was noted to have complete loss of cartilage and   bone-on-bone arthritis with associated osteophytes in the medial and patellofemoral compartments of   the knee with a significant synovitis and associated effusion.      PROCEDURE:  Left total knee replacement.      COMPONENTS USED:  DePuy Attune rotating platform posterior stabilized knee   system, a size 7 femur, 6 tibia, size 6 mm PS AOX insert, and 38 anatomic patellar   button.      SURGEON:  Madlyn Frankel. Charlann Boxer, M.D.      ASSISTANT:  Lanney Gins, PA-C.      ANESTHESIA:  Regional and Spinal.      SPECIMENS:  None.      COMPLICATION:  None.      DRAINS:  None.  EBL: <150cc      TOURNIQUET TIME:   30 min at mmHg     The patient was stable to the recovery room.      INDICATION FOR PROCEDURE:  [name]. is a 67 y.o. male patient of   mine.  The patient had been seen, evaluated, and treated conservatively in the   office with medication, activity modification, and injections.  The patient had   radiographic changes of bone-on-bone arthritis bilaterally with endplate sclerosis and osteophytes noted predominantly in the media and patella compartments     The patient failed conservative measures including medication, injections, and activity modification, and at this point was ready for more definitive measures.   Based on the radiographic changes and failed conservative measures, the patient   decided to proceed with bilateral simultaneous total knee replacements.  Risks of infection,   DVT, component failure, need for revision surgery, postop course, and   expectations were all   discussed and reviewed.  Specific risks and benefits, pros and cons were reviewed as it pertained to staged versus simultaneous procedures.  Consent was obtained for benefit of pain   relief.      PROCEDURE IN DETAIL:  The patient was brought to the operative theater.   Once adequate anesthesia, preoperative antibiotics, 2 gm of Ancef, 1 gm of Tranexamic Acid, and 10 mg of Decadron administered, the patient was positioned supine with the  left thigh tourniquet placed.  The  left lower extremity was prepped and draped in sterile fashion.  A time-   out was performed identifying the patient, planned procedure, and   extremity.      The left lower extremity was placed in the [facility] leg holder.  The leg was   exsanguinated, tourniquet elevated to 250 mmHg.  A midline incision was   made followed by median parapatellar arthrotomy.  Following initial   exposure, attention was first directed to the patella.  Precut   measurement was noted to be 23 mm.  I resected down to 14 mm and used a   38 anatomic patellar button to restore patellar height as well as cover the cut   surface. Lateral facet excised.     The lug holes were drilled and a metal shim was placed to protect the   patella from retractors and saw blades.      At this point, attention was now directed to the femur.  The femoral   canal was opened with a drill, irrigated to try to prevent fat emboli.  An   intramedullary rod was passed at 5 degrees valgus, 9 mm of bone was   resected off the distal femur.   Following this resection, the tibia was   subluxated anteriorly.  Using the extramedullary guide, 2 mm of bone was resected off   the proximal medial tibia.  We confirmed the gap would be   stable medially and laterally with a size 5 spacer block as well as confirmed   the cut was perpendicular in the coronal plane, checking with an alignment rod.      Once this was done, I sized the femur to be a size 7 in the anterior-   posterior dimension, chose a standard component based on medial and   lateral dimension.  The size 7 rotation block was then pinned in   position anterior referenced using the C-clamp to set rotation.  The   anterior, posterior, and  chamfer cuts were made without difficulty nor   notching making certain that I was along the anterior cortex to help   with flexion gap stability.      The final box cut was made off the lateral aspect of distal femur.      At this point, the tibia was sized to be a size 6, the size 6 tray was   then pinned in position through the medial third of the tubercle,   drilled, and keel punched.  Trial reduction was now carried with a 7 femur,  6 tibia, a size 5 then 6 mm PS  insert, and the 38 antomic patella botton.  The knee was brought to   extension, full extension with good flexion stability with the patella   tracking through the trochlea without application of pressure.  Given   all these findings the femoral lug holes were drilled the trial components removed.  Final components were   opened and cement was mixed.  The knee was irrigated with normal saline   solution and pulse lavage.  The synovial lining was   then injected with one half of a combination of 30 cc of 0.25% Marcaine with epinephrine and 1 cc of Toradol plus 30 cc of NS for a total of 61 cc.      The knee was irrigated.  Final implants were then cemented onto clean and   dried cut surfaces of bone with the knee brought  to extension with a size 6 mm PS trial insert.       Once the cement had fully cured, the excess cement was removed   throughout the knee.  I confirmed I was satisfied with the range of   motion and stability, and the final size 6 mm PS AOX insert was chosen.  It was   placed into the knee.      The tourniquet had been let down at 30 minutes.  No significant   hemostasis required.  The   extensor mechanism was then reapproximated using #1 Vicryl and #1 Stratafix sutures with the knee   in flexion.  The   remaining wound was closed with 2-0 Vicryl and running 4-0 Monocryl.   The knee was cleaned, dried, dressed sterilely using Dermabond and   Aquacel dressing.  The patient was then   brought to recovery room in stable condition, tolerating the procedure   well.   Please note that Physician Assistant, Lanney Gins, PA-C, was present for the entirety of the case, and was utilized for pre-operative positioning, peri-operative retractor management, general facilitation of the procedure.  He was also utilized for primary wound closure at the end of the case.  While Mr Carmon Sails was completing closure on the left knee I began the right knee procedure documented below              Madlyn Frankel. Charlann Boxer, M.D.    05/27/2017 10:19 AM  NAME:  [name].                      MEDICAL RECORD NO.:  [id number]                             FACILITY:  [facility]      PHYSICIAN:  Madlyn Frankel. Charlann Boxer, M.D.  DATE OF BIRTH:  September 18, 1950      DATE OF PROCEDURE:  05/27/2017                                     OPERATIVE REPORT         PREOPERATIVE DIAGNOSIS:  Right knee osteoarthritis.      POSTOPERATIVE DIAGNOSIS:  Right knee osteoarthritis.      FINDINGS:  The patient was noted to have complete loss of cartilage and   bone-on-bone arthritis with associated osteophytes in the medial and patellofemoral compartments of   the knee with a significant synovitis and associated effusion.      PROCEDURE:  Right total knee replacement.      COMPONENTS USED:  DePuy  Attune rotating platform posterior stabilized knee   system, a size 6 femur, 6 tibia, size 7 mm PS AOX insert, and 38 anatomic patellar   button.      SURGEON:  Madlyn Frankel. Charlann Boxer, M.D.      ASSISTANT:  Lanney Gins, PA-C.      ANESTHESIA:  Regional and Spinal.      SPECIMENS:  None.      COMPLICATION:  None.      DRAINS:  None  EBL: <100c      TOURNIQUET TIME:  45 min at 250 mmHg     The patient was stable to the recovery room.      INDICATION FOR PROCEDURE:  Giavanni Odonovan. is a 67 y.o. male patient of  mine.  See above     PROCEDURE IN DETAIL:       The both lower extremities had been placed in the [facility] leg holder.  The right leg was now  exsanguinated, tourniquet elevated to 250 mmHg.  A midline incision was   made followed by median parapatellar arthrotomy.  Following initial   exposure, attention was first directed to the patella.  Precut   measurement was noted to be 22 mm.  I resected down to 14 mm and used a   38 anatomic patellar button to restore patellar height as well as cover the cut   surface.      The lug holes were drilled and a metal shim was placed to protect the   patella from retractors and saw blades.      At this point, attention was now directed to the femur.  The femoral   canal was opened with a drill, irrigated to try to prevent fat emboli.  An   intramedullary rod was passed at 5 degrees valgus, 9 mm of bone was   resected off the distal femur.  Following this resection, the tibia was   subluxated anteriorly.  Using the extramedullary guide, 2 mm of bone was resected off   the proximal medial tibia.  We confirmed the gap would be   stable medially and laterally with a size 5 spacer block as well as confirmed   the cut was perpendicular in the coronal plane, checking with an alignment rod.      Once this was done, I sized the femur to be a size 6 in the anterior-   posterior dimension, chose a standard component based on medial and    lateral dimension.  The size 6 rotation block was then pinned in   position anterior referenced using the C-clamp to set rotation.  The   anterior, posterior, and  chamfer cuts were made without difficulty nor   notching making certain that I was along the anterior cortex to help   with flexion gap stability.      The final box cut was made off the lateral aspect of distal femur.      At this point, the tibia was sized to be a size 6, the size 6 tray was   then pinned in position through the medial third of the tubercle,   drilled, and keel punched.  Trial reduction was now carried with a 6 femur,  6 tibia, a size 6 then 7 mm PS insert, and the 38 anatomic patella botton.  The knee was brought to   extension, full extension with good flexion stability with the patella   tracking through the trochlea without application of pressure.  Given   all these findings.  Final components were   opened and cement was mixed.  The knee was irrigated with normal saline   solution and pulse lavage.  The synovial lining was   then injected with the remaining half of the combination of 30 cc of0.25% Marcaine with epinephrine and 1 cc of Toradol plus 30 cc of NS for a  total of 61 cc.      The knee was irrigated.  Final implants were then cemented onto clean and   dried cut surfaces of bone with the knee brought to extension with a size 7 mm PS  trial insert.      Once the cement had fully cured, the excess cement was removed   throughout the knee.  I confirmed I was satisfied with the range of   motion and stability, and the final size 7 PS AOX mm insert was chosen.  It was   placed into the knee.      The tourniquet had been let down at 45 minutes.  No significant   hemostasis required.  The   extensor mechanism was then reapproximated using #1 Vicryl and #1 Stratafix sutures with the knee in flexion.  The remaining wound was closed with 2-0 Vicryl and running 4-0 Monocryl. The knee was cleaned, dried,  dressed sterilely using Dermabond and  Aquacel dressing.  The patient was then   brought to recovery room in stable condition, tolerating the procedure   well.   Please note that Physician Assistant, Lanney GinsMatthew Babish, PA-C, was present for the entirety of the case, and was utilized for pre-operative positioning, peri-operative retractor management, general facilitation of the procedure.  He was also utilized for primary wound closure at the end of the case.              Madlyn FrankelMatthew D. Charlann Boxerlin, M.D.    05/27/2017 10:19 AM

## 2017-05-27 NOTE — Progress Notes (Signed)
PACU Nursing Note: pt continues to await for room assignment on Ortho Unit. Pt informed of reason for no room assignment, has been very understanding. Comfort measures provided. Meal tray provided. Comfort measures in place. Pt alert and oriented. Family also aware of holding needs. Keeping pt and family up to date on room status situation.

## 2017-05-27 NOTE — Anesthesia Preprocedure Evaluation (Addendum)
Anesthesia Evaluation   Patient awake    Reviewed: Allergy & Precautions, NPO status , Patient's Chart, lab work & pertinent test results  History of Anesthesia Complications Negative for: history of anesthetic complications  Airway Mallampati: I  TM Distance: >3 FB Neck ROM: Full    Dental   Pulmonary neg pulmonary ROS,    breath sounds clear to auscultation       Cardiovascular hypertension, Pt. on medications (-) angina(-) Past MI and (-) CHF (-) dysrhythmias  Rhythm:Regular     Neuro/Psych negative neurological ROS  negative psych ROS   GI/Hepatic Neg liver ROS, GERD  Controlled,  Endo/Other  Hypothyroidism   Renal/GU negative Renal ROS     Musculoskeletal  (+) Arthritis , Rheumatoid disorders,    Abdominal   Peds  Hematology negative hematology ROS (+)   Anesthesia Other Findings   Reproductive/Obstetrics                             Anesthesia Physical Anesthesia Plan  ASA: II  Anesthesia Plan: Spinal, Regional and MAC   Post-op Pain Management:    Induction:   PONV Risk Score and Plan: 1 and Treatment may vary due to age or medical condition  Airway Management Planned: Nasal Cannula  Additional Equipment:   Intra-op Plan:   Post-operative Plan:   Informed Consent: I have reviewed the patients History and Physical, chart, labs and discussed the procedure including the risks, benefits and alternatives for the proposed anesthesia with the patient or authorized representative who has indicated his/her understanding and acceptance.   Dental advisory given  Plan Discussed with: CRNA and Surgeon  Anesthesia Plan Comments:         Anesthesia Quick Evaluation

## 2017-05-27 NOTE — Anesthesia Postprocedure Evaluation (Signed)
Anesthesia Post Note  Patient: Ethan PatrickRaymond W Pfister Jr.  Procedure(s) Performed: TOTAL KNEE BILATERAL (Bilateral )     Patient location during evaluation: PACU Anesthesia Type: Regional and Spinal Level of consciousness: oriented and awake and alert Pain management: pain level controlled Vital Signs Assessment: post-procedure vital signs reviewed and stable Respiratory status: spontaneous breathing and respiratory function stable Cardiovascular status: blood pressure returned to baseline and stable Postop Assessment: no headache, no backache and no apparent nausea or vomiting Anesthetic complications: no    Last Vitals:  Vitals:   05/27/17 1500 05/27/17 1515  BP: 128/71 120/78  Pulse: 74 70  Resp: 12 11  Temp: 36.6 C   SpO2: 100% 100%    Last Pain:  Vitals:   05/27/17 0932  TempSrc: Oral                 Ethan Pineda

## 2017-05-27 NOTE — Discharge Instructions (Addendum)

## 2017-05-27 NOTE — Anesthesia Procedure Notes (Signed)
Anesthesia Regional Block: Adductor canal block   Pre-Anesthetic Checklist: ,, timeout performed, Correct Patient, Correct Site, Correct Laterality, Correct Procedure, Correct Position, site marked, Risks and benefits discussed,  Surgical consent,  Pre-op evaluation,  At surgeon's request and post-op pain management  Laterality: Lower and Left  Prep: chloraprep       Needles:  Injection technique: Single-shot  Needle Type: Echogenic Stimulator Needle          Additional Needles:   Procedures:,,,, ultrasound used (permanent image in chart),,,,  Narrative:  Start time: 05/27/2017 1:06 PM End time: 05/27/2017 1:13 PM Injection made incrementally with aspirations every 5 mL.  Performed by: Personally  Anesthesiologist: Val EagleMoser, Stina Gane, MD  Additional Notes: H+P and labs reviewed, risks and benefits discussed with patient, procedure tolerated well without complications

## 2017-05-28 ENCOUNTER — Encounter (HOSPITAL_COMMUNITY): Payer: Self-pay | Admitting: Orthopedic Surgery

## 2017-05-28 LAB — CBC
HCT: 30.5 % — ABNORMAL LOW (ref 39.0–52.0)
HEMOGLOBIN: 10.5 g/dL — AB (ref 13.0–17.0)
MCH: 28.9 pg (ref 26.0–34.0)
MCHC: 34.4 g/dL (ref 30.0–36.0)
MCV: 84 fL (ref 78.0–100.0)
PLATELETS: 170 10*3/uL (ref 150–400)
RBC: 3.63 MIL/uL — AB (ref 4.22–5.81)
RDW: 16.5 % — ABNORMAL HIGH (ref 11.5–15.5)
WBC: 12.4 10*3/uL — ABNORMAL HIGH (ref 4.0–10.5)

## 2017-05-28 LAB — BASIC METABOLIC PANEL
Anion gap: 10 (ref 5–15)
BUN: 18 mg/dL (ref 6–20)
CO2: 23 mmol/L (ref 22–32)
CREATININE: 1.1 mg/dL (ref 0.61–1.24)
Calcium: 8.7 mg/dL — ABNORMAL LOW (ref 8.9–10.3)
Chloride: 108 mmol/L (ref 101–111)
Glucose, Bld: 127 mg/dL — ABNORMAL HIGH (ref 65–99)
POTASSIUM: 4.2 mmol/L (ref 3.5–5.1)
SODIUM: 141 mmol/L (ref 135–145)

## 2017-05-28 NOTE — Progress Notes (Signed)
Physical Therapy Treatment Patient Details Name: Ethan PatrickRaymond W Kuras Jr. 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     Follow Up Recommendations  Follow surgeon's recommendation for DC plan and follow-up therapies     Equipment Recommendations  Rolling walker with 5" wheels    Recommendations for Other Services       Precautions / Restrictions Precautions Precautions: Knee Precaution Comments: verbally reviewed  Restrictions Weight Bearing Restrictions: No Other Position/Activity Restrictions: WBAT Bilateral    Mobility  Bed Mobility               General bed mobility comments: pt up in recliner   Transfers Overall transfer level: Needs assistance Equipment used: Rolling walker (2 wheeled) Transfers: Sit to/from Stand Sit to Stand: Min assist Stand pivot transfers: Mod assist       General transfer comment: verbal cues for technique, assist to control descent, pt with good upper body strength to self assist  Ambulation/Gait Ambulation/Gait assistance: Min assist Ambulation Distance (Feet): 160 Feet Assistive device: Rolling walker (2 wheeled) Gait Pattern/deviations: Decreased stride length;Wide base of support;Antalgic Gait velocity: decr   General Gait Details: verbal cues for technique, RW positioning, and posture, assist for steadying   Stairs            Wheelchair Mobility    Modified Rankin (Stroke Patients Only)       Balance                                            Cognition Arousal/Alertness: Awake/alert Behavior During Therapy: WFL for tasks assessed/performed Overall Cognitive Status: Within Functional Limits for tasks assessed                                         Exercises Total Joint Exercises  Quad Sets: AROM;10 reps;Both;Supine  Heel Slides: AAROM;10 reps;Both;Seated     General Comments        Pertinent Vitals/Pain Pain Assessment: 0-10 Pain Score: 5  Pain Location: bil knees Pain Descriptors / Indicators: Sore;Aching;Tightness Pain Intervention(s): Limited activity within patient's tolerance;Repositioned;Monitored during session    Home Living Family/patient expects to be discharged to:: Private residence Living Arrangements: Spouse/significant other Available Help at Discharge: Family Type of Home: House Home Access: Stairs to enter   Home Layout: Two level;Bed/bath upstairs;Able to live on main level with bedroom/bathroom Home Equipment: None Additional Comments: reports hospital bed being delivered so he can stay on main level; reports he will be getting a 3:1 BSC     Prior Function Level of Independence: Independent          PT Goals (current goals can now be found in the care plan section) Acute Rehab PT Goals Patient Stated Goal: regain independence PT Goal Formulation: With patient/family Time For Goal Achievement: 06/01/17 Potential to Achieve Goals: Good Progress towards PT goals: Progressing toward goals    Frequency    7X/week      PT Plan Current plan remains appropriate    Co-evaluation  AM-PAC PT "6 Clicks" Daily Activity  Outcome Measure  Difficulty turning over in bed (including adjusting bedclothes, sheets and blankets)?: A Lot Difficulty moving from lying on back to sitting on the side of the bed? : Unable Difficulty sitting down on and standing up from a chair with arms (e.g., wheelchair, bedside commode, etc,.)?: Unable Help needed moving to and from a bed to chair (including a wheelchair)?: A Little Help needed walking in hospital room?: A Little Help needed climbing 3-5 steps with a railing? : A Lot 6 Click Score: 12    End of Session Equipment Utilized During  Treatment: Gait belt Activity Tolerance: Patient tolerated treatment well Patient left: in chair;with call bell/phone within reach;with family/visitor present   PT Visit Diagnosis: Other abnormalities of gait and mobility (R26.89)     Time: 8295-6213 PT Time Calculation (min) (ACUTE ONLY): 13 min  Charges:  $Gait Training: 8-22 mins                    G Codes:       Zenovia Jarred, PT, DPT 05/28/2017 Pager: [phone]  Maida Sale E 05/28/2017, 3:25 PM

## 2017-05-28 NOTE — Evaluation (Addendum)
Occupational Therapy Evaluation Patient Details Name: Ethan PatrickRaymond W Foskey Jr. MRN: [id number] DOB: 04-04-1950 Today's Date: 05/28/2017    History of Present Illness Pt is a 67 year old male s/p bilateral TKAs   Clinical Impression   This 67 y/o M presents with the above. Pt reports he lives alone, at baseline is independent with ADLs and functional mobility. Pt presenting with post-op pain and weakness, requiring modA for sit<>stand and stand pivot transfer this session; currently requires maxA for LB ADLs. Pt motivated and willing to work with therapy to progress towards PLOF, reports he has family who is able to assist PRN at time of discharge. Pt will benefit from continued acute OT services to maximize his overall safety and independence with ADLs and mobility prior to return home.     Follow Up Recommendations  Follow surgeon's recommendation for DC plan and follow-up therapies;Supervision/Assistance - 24 hour    Equipment Recommendations  Tub/shower bench           Precautions / Restrictions Precautions Precautions: Knee Precaution Comments: verbally reviewed  Restrictions Weight Bearing Restrictions: No Other Position/Activity Restrictions: WBAT Bilateral      Mobility Bed Mobility               General bed mobility comments: Pt sitting EOB upon arrival   Transfers Overall transfer level: Needs assistance Equipment used: Rolling walker (2 wheeled) Transfers: Sit to/from UGI CorporationStand;Stand Pivot Transfers Sit to Stand: Mod assist;From elevated surface Stand pivot transfers: Mod assist       General transfer comment: pt stood from EOB x2, assist to rise and steady at RW with verbal cues for hand placement; steadying assist to pivot to recliner; pt with good UB strength to self assist                                                ADL either performed or assessed with clinical judgement   ADL Overall ADL's : Needs  assistance/impaired Eating/Feeding: Modified independent;Sitting   Grooming: Set up;Sitting   Upper Body Bathing: Min guard;Sitting   Lower Body Bathing: Moderate assistance;Sit to/from stand   Upper Body Dressing : Min guard;Sitting   Lower Body Dressing: Sit to/from stand;Maximal assistance Lower Body Dressing Details (indicate cue type and reason): pt able to reach and adjust socks around ankles; educated on compensatory techniques for task completion  Toilet Transfer: Moderate assistance;Stand-pivot;BSC;RW Toilet Transfer Details (indicate cue type and reason): simulated in transfer to recliner  Toileting- Clothing Manipulation and Hygiene: Moderate assistance;Sit to/from stand       Functional mobility during ADLs: Moderate assistance;Rolling walker General ADL Comments: pt stood x2 from EOB and completed stand pivot to recliner, limited by pain but is motivated to progress with therapy                          Pertinent Vitals/Pain Pain Assessment: 0-10 Pain Score: 8  Pain Location: bil knees Pain Descriptors / Indicators: Sore;Aching;Tightness Pain Intervention(s): Limited activity within patient's tolerance;Monitored during session;RN gave pain meds during session;Ice applied          Extremity/Trunk Assessment Upper Extremity Assessment Upper Extremity Assessment: Overall WFL for tasks assessed   Lower Extremity Assessment Lower Extremity Assessment: RLE deficits/detail;LLE deficits/detail RLE Deficits / Details: approximately AAROM supine R knee -5-85* LLE Deficits / Details: approximately AAROM supine  L knee -8-45*    Cervical / Trunk Assessment Cervical / Trunk Assessment: Normal   Communication Communication Communication: No difficulties   Cognition Arousal/Alertness: Awake/alert Behavior During Therapy: WFL for tasks assessed/performed Overall Cognitive Status: Within Functional Limits for tasks assessed                                                      Home Living Family/patient expects to be discharged to:: Private residence Living Arrangements: Spouse/significant other Available Help at Discharge: Family Type of Home: House Home Access: Stairs to enter Secretary/administrator of Steps: 1   Home Layout: Two level;Bed/bath upstairs;Able to live on main level with bedroom/bathroom     Bathroom Shower/Tub: Chief Strategy Officer: Standard     Home Equipment: None   Additional Comments: reports hospital bed being delivered so he can stay on main level; reports he will be getting a 3:1 BSC       Prior Functioning/Environment Level of Independence: Independent                 OT Problem List: Pain      OT Treatment/Interventions: Self-care/ADL training;DME and/or AE instruction;Therapeutic activities;Balance training;Therapeutic exercise;Energy conservation;Patient/family education    OT Goals(Current goals can be found in the care plan section) Acute Rehab OT Goals Patient Stated Goal: regain independence OT Goal Formulation: With patient Time For Goal Achievement: 06/04/17 Potential to Achieve Goals: Good ADL Goals Pt Will Perform Grooming: with modified independence;sitting Pt Will Perform Lower Body Bathing: with modified independence;sit to/from stand Pt Will Perform Lower Body Dressing: with modified independence;sit to/from stand Pt Will Transfer to Toilet: with modified independence;ambulating;bedside commode(BSC over toilet) Pt Will Perform Toileting - Clothing Manipulation and hygiene: with modified independence;sit to/from stand Pt Will Perform Tub/Shower Transfer: Tub transfer;with supervision;ambulating;3 in 1;rolling walker  OT Frequency: Min 2X/week                             AM-PAC PT "6 Clicks" Daily Activity     Outcome Measure Help from another person eating meals?: None Help from another person taking care of personal grooming?: A  Little Help from another person toileting, which includes using toliet, bedpan, or urinal?: A Little Help from another person bathing (including washing, rinsing, drying)?: A Lot Help from another person to put on and taking off regular upper body clothing?: None Help from another person to put on and taking off regular lower body clothing?: A Lot 6 Click Score: 18   End of Session Equipment Utilized During Treatment: Gait belt;Rolling walker Nurse Communication: Mobility status  Activity Tolerance: Patient tolerated treatment well Patient left: in chair;with call bell/phone within reach;with chair alarm set;with nursing/sitter in room;with family/visitor present  OT Visit Diagnosis: Other abnormalities of gait and mobility (R26.89);Pain Pain - Right/Left: (bil (L>R)) Pain - part of body: Knee                Time: 4098-1191 OT Time Calculation (min): 37 min Charges:  OT General Charges $OT Visit: 1 Visit OT Evaluation $OT Eval Low Complexity: 1 Low OT Treatments $Self Care/Home Management : 8-22 mins G-Codes:     Marcy Siren, OT Pager [phone] 05/28/2017   Orlando Penner 05/28/2017, 1:32 PM

## 2017-05-28 NOTE — Addendum Note (Signed)
Addendum  created 05/28/17 0657 by Elyn PeersAllen, Lennie Dunnigan J, CRNA   Charge Capture section accepted

## 2017-05-28 NOTE — Evaluation (Signed)
Physical Therapy Evaluation Patient Details Name: Ethan PatrickRaymond W Siemen Jr. MRN: [id number] DOB: 1951/03/01 Today's Date: 05/28/2017   History of Present Illness  Pt is a 67 year old male s/p bilateral TKAs  Clinical Impression  Pt is s/pbilateral TKAs resulting in the deficits listed below (see PT Problem List).  Pt will benefit from skilled PT to increase their independence and safety with mobility to allow discharge to the venue listed below. Pt already OOB in recliner on arrival.  Pt performed LE exercises and ambulated in hallway.  Anticipate pt will progress well.  Plans per chart for d/c home with f/u OP PT.     Follow Up Recommendations Follow surgeon's recommendation for DC plan and follow-up therapies    Equipment Recommendations  Rolling walker with 5" wheels    Recommendations for Other Services       Precautions / Restrictions Precautions Precautions: Knee Restrictions Other Position/Activity Restrictions: WBAT Bilateral      Mobility  Bed Mobility               General bed mobility comments: pt up in recliner   Transfers Overall transfer level: Needs assistance Equipment used: Rolling walker (2 wheeled) Transfers: Sit to/from Stand Sit to Stand: Min assist         General transfer comment: verbal cues for technique, assist to control descent, pt with good upper body strength to self assist  Ambulation/Gait Ambulation/Gait assistance: Min assist Ambulation Distance (Feet): 60 Feet Assistive device: Rolling walker (2 wheeled) Gait Pattern/deviations: Decreased stride length;Wide base of support;Antalgic Gait velocity: decr   General Gait Details: verbal cues for technique, sequencing, RW positioning, assist for steadying, pt attempting to use less weight through UEs upon returning to room  Stairs            Wheelchair Mobility    Modified Rankin (Stroke Patients Only)       Balance                                              Pertinent Vitals/Pain Pain Assessment: 0-10 Pain Score: 6  Pain Location: bil knees Pain Descriptors / Indicators: Sore;Aching;Tightness Pain Intervention(s): Limited activity within patient's tolerance;Repositioned;Monitored during session;Ice applied;RN gave pain meds during session    Home Living Family/patient expects to be discharged to:: Private residence Living Arrangements: Spouse/significant other Available Help at Discharge: Family Type of Home: House Home Access: Stairs to enter   Secretary/administratorntrance Stairs-Number of Steps: 1 Home Layout: Two level;Bed/bath upstairs Home Equipment: None Additional Comments: reports hospital bed being delivered so he can stay on main level    Prior Function Level of Independence: Independent               Hand Dominance        Extremity/Trunk Assessment        Lower Extremity Assessment Lower Extremity Assessment: RLE deficits/detail;LLE deficits/detail RLE Deficits / Details: approximately AAROM supine R knee -5-85* LLE Deficits / Details: approximately AAROM supine L knee -8-45*        Communication   Communication: No difficulties  Cognition Arousal/Alertness: Awake/alert Behavior During Therapy: WFL for tasks assessed/performed Overall Cognitive Status: Within Functional Limits for tasks assessed  General Comments      Exercises Total Joint Exercises Ankle Circles/Pumps: AROM;Both;10 reps Quad Sets: AROM;10 reps;Both Short Arc Quad: AAROM;10 reps;Both Heel Slides: AAROM;10 reps;Both Hip ABduction/ADduction: AAROM;10 reps;Both Straight Leg Raises: AAROM;10 reps;Both   Assessment/Plan    PT Assessment Patient needs continued PT services  PT Problem List Decreased strength;Decreased mobility;Decreased activity tolerance;Pain;Decreased knowledge of precautions;Decreased knowledge of use of DME;Decreased range of motion       PT Treatment  Interventions Stair training;Gait training;Therapeutic exercise;DME instruction;Therapeutic activities;Patient/family education;Functional mobility training    PT Goals (Current goals can be found in the Care Plan section)  Acute Rehab PT Goals PT Goal Formulation: With patient/family Time For Goal Achievement: 06/01/17 Potential to Achieve Goals: Good    Frequency 7X/week   Barriers to discharge        Co-evaluation               AM-PAC PT "6 Clicks" Daily Activity  Outcome Measure Difficulty turning over in bed (including adjusting bedclothes, sheets and blankets)?: A Lot Difficulty moving from lying on back to sitting on the side of the bed? : Unable Difficulty sitting down on and standing up from a chair with arms (e.g., wheelchair, bedside commode, etc,.)?: Unable Help needed moving to and from a bed to chair (including a wheelchair)?: A Little Help needed walking in hospital room?: A Little Help needed climbing 3-5 steps with a railing? : A Lot 6 Click Score: 12    End of Session Equipment Utilized During Treatment: Gait belt Activity Tolerance: Patient tolerated treatment well Patient left: in chair;with call bell/phone within reach;with family/visitor present   PT Visit Diagnosis: Other abnormalities of gait and mobility (R26.89)    Time: 1610-9604 PT Time Calculation (min) (ACUTE ONLY): 23 min   Charges:   PT Evaluation $PT Eval Low Complexity: 1 Low PT Treatments $Therapeutic Exercise: 8-22 mins   PT G Codes:        Zenovia Jarred, PT, DPT 05/28/2017 Pager: [phone]  Maida Sale E 05/28/2017, 12:54 PM

## 2017-05-28 NOTE — Progress Notes (Signed)
Patient ID: Ethan PatrickRaymond W Eckard Jr., male   DOB: 1950/08/27, 67 y.o.   MRN: [id number] Subjective: 1 Day Post-Op Procedure(s) (LRB): TOTAL KNEE BILATERAL (Bilateral)    Patient reports pain as mild to moderate.  Otherwise doing well, no events  Objective:   VITALS:   Vitals:   05/28/17 0635 05/28/17 0912  BP:  123/78  Pulse: 68 100  Resp: 18 16  Temp:  98.1 F (36.7 C)  SpO2: 100% 99%    Neurovascular intact Incision: dressing C/D/I - bilateral  LABS Recent Labs    05/28/17 0556  HGB 10.5*  HCT 30.5*  WBC 12.4*  PLT 170    Recent Labs    05/28/17 0556  NA 141  K 4.2  BUN 18  CREATININE 1.10  GLUCOSE 127*    No results for input(s): LABPT, INR in the last 72 hours.   Assessment/Plan: 1 Day Post-Op Procedure(s) (LRB): TOTAL KNEE BILATERAL (Bilateral)   Advance diet Up with therapy Plan for discharge tomorrow with outpt PT  Gait training, motion Goals reveiwed

## 2017-05-29 LAB — CBC
HEMATOCRIT: 25.7 % — AB (ref 39.0–52.0)
HEMOGLOBIN: 9.1 g/dL — AB (ref 13.0–17.0)
MCH: 30 pg (ref 26.0–34.0)
MCHC: 35.4 g/dL (ref 30.0–36.0)
MCV: 84.8 fL (ref 78.0–100.0)
Platelets: 182 10*3/uL (ref 150–400)
RBC: 3.03 MIL/uL — ABNORMAL LOW (ref 4.22–5.81)
RDW: 16.6 % — AB (ref 11.5–15.5)
WBC: 11.1 10*3/uL — ABNORMAL HIGH (ref 4.0–10.5)

## 2017-05-29 LAB — BASIC METABOLIC PANEL
Anion gap: 6 (ref 5–15)
BUN: 21 mg/dL — AB (ref 6–20)
CALCIUM: 8.6 mg/dL — AB (ref 8.9–10.3)
CHLORIDE: 107 mmol/L (ref 101–111)
CO2: 26 mmol/L (ref 22–32)
CREATININE: 1.07 mg/dL (ref 0.61–1.24)
GFR calc non Af Amer: >60 mL/min (ref >60)
Glucose, Bld: 109 mg/dL — ABNORMAL HIGH (ref 65–99)
Potassium: 4.3 mmol/L (ref 3.5–5.1)
Sodium: 139 mmol/L (ref 135–145)

## 2017-05-29 NOTE — Progress Notes (Signed)
Reviewed all d/c instructions with patient. All questions answered. Patient awaiting transportation. Will send all belongings and prescriptions with patient.

## 2017-05-29 NOTE — Discharge Summary (Signed)
Physician Discharge Summary  Patient ID: Ethan Pineda. MRN: [id number] DOB/AGE: 10/08/1950 67 y.o.  Admit date: 05/27/2017 Discharge date:  05/29/2017   Procedures:  Procedure(s) (LRB): TOTAL KNEE BILATERAL (Bilateral)  Attending Physician:  Dr. Durene Romans   Admission Diagnoses:   Bilateral knee primary OA / pain  Discharge Diagnoses:  Principal Problem:   S/P bilateral TKAs Active Problems:   Status post bilateral knee replacements  Past Medical History:  Diagnosis Date  . Arthritis   . GERD (gastroesophageal reflux disease)   . Hypertension   . Hypothyroidism     HPI:    Ethan Pineda., 67 y.o. male, has a history of pain and functional disability in the bilateral knee due to arthritis and has failed non-surgical conservative treatments for greater than 12 weeks to include NSAID's and/or analgesics, corticosteriod injections, viscosupplementation injections and activity modification.  Onset of symptoms was gradual, starting >10 years ago with gradually worsening course since that time. The patient noted no past surgery on the bilateral knees.  Patient currently rates pain in the bilateral knees at 8 out of 10 with activity. Patient has night pain, worsening of pain with activity and weight bearing, pain that interferes with activities of daily living, pain with passive range of motion, crepitus and joint swelling.  Patient has evidence of periarticular osteophytes and joint space narrowing by imaging studies.  There is no active infection.  Risks, benefits and expectations were discussed with the patient.  Risks including but not limited to the risk of anesthesia, blood clots, nerve damage, blood vessel damage, failure of the prosthesis, infection and up to and including death.  Patient understand the risks, benefits and expectations and wishes to proceed with surgery.  PCP: Zachery Dauer, MD   Discharged Condition: good  Hospital Course:  Patient underwent  the above stated procedure on 05/27/2017. Patient tolerated the procedure well and brought to the recovery room in good condition and subsequently to the floor.  POD #1 BP: 123/78 ; Pulse: 100 ; Temp: 98.1 F (36.7 C) ; Resp: 16 Patient reports pain as mild to moderate.  Otherwise doing well, no events. Bilaterally: Neurovascular intact and incision: dressing C/D/I.   LABS  Basename    HGB     10.5  HCT     30.5   POD #2  BP: 124/87 ; Pulse: 78 ; Temp: 98.4 F (36.9 C) ; Resp: 16 Patient reports pain as mild, pain controlled. No events throughout the night.  Progressing well.  Discussed recovery period expectations. Ready to be discharged home.  Dorsiflexion/plantar flexion intact, incision: dressing C/D/I, no cellulitis present and compartment soft.   LABS  Basename    HGB     9.1  HCT     25.7    Discharge Exam: General appearance: alert, cooperative and no distress Extremities: Homans sign is negative, no sign of DVT, no edema, redness or tenderness in the calves or thighs and no ulcers, gangrene or trophic changes  Disposition: Home with follow up in 2 weeks   Follow-up Information    Durene Romans, MD. Schedule an appointment as soon as possible for a visit in 2 week(s).   Specialty:  Orthopedic Surgery Contact information: [address] [place] [address] [phone]           Discharge Instructions    Call MD / Call 911   Complete by:  As directed    If you experience chest pain or  shortness of breath, CALL 911 and be transported to the hospital emergency room.  If you develope a fever above 101 F, pus (white drainage) or increased drainage or redness at the wound, or calf pain, call your surgeon's office.   Change dressing   Complete by:  As directed    Maintain surgical dressing until follow up in the clinic. If the edges start to pull up, may reinforce with tape. If the dressing is no longer working, may remove and cover with gauze and  tape, but must keep the area dry and clean.  Call with any questions or concerns.   Constipation Prevention   Complete by:  As directed    Drink plenty of fluids.  Prune juice may be helpful.  You may use a stool softener, such as Colace (over the counter) 100 mg twice a day.  Use MiraLax (over the counter) for constipation as needed.   Diet - low sodium heart healthy   Complete by:  As directed    Discharge instructions   Complete by:  As directed    Maintain surgical dressing until follow up in the clinic. If the edges start to pull up, may reinforce with tape. If the dressing is no longer working, may remove and cover with gauze and tape, but must keep the area dry and clean.  Follow up in 2 weeks at [facility]. Call with any questions or concerns.   Increase activity slowly as tolerated   Complete by:  As directed    Weight bearing as tolerated with assist device (walker, cane, etc) as directed, use it as long as suggested by your surgeon or therapist, typically at least 4-6 weeks.   TED hose   Complete by:  As directed    Use stockings (TED hose) for 2 weeks on both leg(s).  You may remove them at night for sleeping.      Allergies as of 05/29/2017   No Known Allergies     Medication List    STOP taking these medications   aspirin EC 81 MG tablet Replaced by:  aspirin 81 MG chewable tablet   hydroxychloroquine 200 MG tablet Commonly known as:  PLAQUENIL   meloxicam 15 MG tablet Commonly known as:  MOBIC   methotrexate 1 g injection Commonly known as:  50 mg/ml     TAKE these medications   acetaminophen 500 MG tablet Commonly known as:  TYLENOL Take 2 tablets (1,000 mg total) by mouth every 8 (eight) hours.   amLODipine 2.5 MG tablet Commonly known as:  NORVASC Take 2.5 mg by mouth daily.   aspirin 81 MG chewable tablet Commonly known as:  ASPIRIN CHILDRENS Chew 1 tablet (81 mg total) by mouth 2 (two) times daily. Start the day after finishing Xarelto.  Take for 4 weeks, then resume regular dose. Start taking on:  06/14/2017 Replaces:  aspirin EC 81 MG tablet   docusate sodium 100 MG capsule Commonly known as:  COLACE Take 1 capsule (100 mg total) by mouth 2 (two) times daily.   ferrous sulfate 325 (65 FE) MG tablet Commonly known as:  FERROUSUL Take 1 tablet (325 mg total) by mouth 3 (three) times daily with meals.   FISH OIL PO Take 520 mg by mouth daily.   folic acid 1 MG tablet Commonly known as:  FOLVITE Take 1 mg by mouth daily.   levothyroxine 88 MCG tablet Commonly known as:  SYNTHROID, LEVOTHROID Take 88 mcg by mouth daily before breakfast.  methocarbamol 500 MG tablet Commonly known as:  ROBAXIN Take 1 tablet (500 mg total) by mouth every 6 (six) hours as needed for muscle spasms.   oxyCODONE 5 MG immediate release tablet Commonly known as:  Oxy IR/ROXICODONE Take 1-2 tablets (5-10 mg total) by mouth every 4 (four) hours as needed for moderate pain or severe pain.   pantoprazole 40 MG tablet Commonly known as:  PROTONIX Take 40 mg by mouth daily.   polyethylene glycol packet Commonly known as:  MIRALAX / GLYCOLAX Take 17 g by mouth 2 (two) times daily.   rivaroxaban 10 MG Tabs tablet Commonly known as:  XARELTO Take 1 tablet (10 mg total) by mouth daily for 14 days. Start taking on:  05/30/2017   vitamin B-12 1000 MCG tablet Commonly known as:  CYANOCOBALAMIN Take 1,000 mcg by mouth daily.   Vitamin D-3 5000 units Tabs Take 5,000 Units by mouth daily.            Discharge Care Instructions  (From admission, onward)        Start     Ordered   05/29/17 0000  Change dressing    Comments:  Maintain surgical dressing until follow up in the clinic. If the edges start to pull up, may reinforce with tape. If the dressing is no longer working, may remove and cover with gauze and tape, but must keep the area dry and clean.  Call with any questions or concerns.   05/29/17 0908       Signed: Anastasio AuerbachMatthew  S. Dail Meece   PA-C  05/29/2017, 9:09 AM

## 2017-05-29 NOTE — Progress Notes (Signed)
Occupational Therapy Treatment Patient Details Name: Timithy Arons. MRN: [id number] DOB: [birth year] Today's Date: 05/29/2017    History of present illness Pt is a 67 year old male s/p bilateral TKAs   OT comments  OT education complete.  Pts friend will A as needed.   Follow Up Recommendations  Follow surgeon's recommendation for DC plan and follow-up therapies;Supervision - Intermittent    Equipment Recommendations  Tub/shower bench(pt may obtain tub bench on his own)    Recommendations for Other Services      Precautions / Restrictions Precautions Precautions: Knee Precaution Comments: verbally reviewed  Restrictions Weight Bearing Restrictions: No Other Position/Activity Restrictions: WBAT Bilateral       Mobility Bed Mobility               General bed mobility comments: pt up in recliner   Transfers Overall transfer level: Needs assistance Equipment used: Rolling walker (2 wheeled) Transfers: Sit to/from Stand Sit to Stand: Min assist         General transfer comment: verbal cues for technique, assist to control descent, pt with good upper body strength to self assist        ADL either performed or assessed with clinical judgement   ADL Overall ADL's : Needs assistance/impaired Eating/Feeding: Modified independent;Sitting   Grooming: Set up;Sitting   Upper Body Bathing: Sitting;Supervision/ safety   Lower Body Bathing: Sit to/from stand;Supervison/ safety   Upper Body Dressing : Sitting;Set up   Lower Body Dressing: Sit to/from stand;Supervision/safety   Toilet Transfer: Moderate assistance;Stand-pivot;BSC;RW Toilet Transfer Details (indicate cue type and reason): simulated in transfer to recliner  Toileting- Clothing Manipulation and Hygiene: Sit to/from Oncologist Details (indicate cue type and reason): verbalized safety Functional mobility during ADLs: Rolling  walker;Supervision/safety General ADL Comments: educated on benefits of a walker bag to carry needed items     Vision Patient Visual Report: No change from baseline            Cognition Arousal/Alertness: Awake/alert Behavior During Therapy: WFL for tasks assessed/performed Overall Cognitive Status: Within Functional Limits for tasks assessed                                                     Pertinent Vitals/ Pain       Pain Score: 3  Pain Location: bil knees Pain Descriptors / Indicators: Sore;Aching Pain Intervention(s): Limited activity within patient's tolerance         Frequency  Min 2X/week        Progress Toward Goals  OT Goals(current goals can now be found in the care plan section)  Progress towards OT goals: Progressing toward goals     Plan Discharge plan remains appropriate       AM-PAC PT "6 Clicks" Daily Activity     Outcome Measure   Help from another person eating meals?: None Help from another person taking care of personal grooming?: A Little Help from another person toileting, which includes using toliet, bedpan, or urinal?: A Little Help from another person bathing (including washing, rinsing, drying)?: A Little Help from another person to put on and taking off regular upper body clothing?: None Help from another person to put on and taking off regular lower body clothing?: A Little 6 Click Score: 20    End of  Session Equipment Utilized During Treatment: Gait belt;Rolling walker  OT Visit Diagnosis: Other abnormalities of gait and mobility (R26.89);Pain Pain - Right/Left: (bil (L>R)) Pain - part of body: Knee   Activity Tolerance Patient tolerated treatment well   Patient Left in chair;with call bell/phone within reach;with nursing/sitter in room   Nurse Communication Mobility status        Time: 0955-1006 OT Time Calculation (min): 11 min  Charges: OT General Charges $OT Visit: 1 Visit OT  Treatments $Self Care/Home Management : 8-22 mins  Clarks GreenLori Malonie Tatum, [place]OT [phone]   Alba CoryREDDING, Prentis Langdon D 05/29/2017, 10:58 AM

## 2017-05-29 NOTE — Progress Notes (Signed)
Physical Therapy Treatment Patient Details Name: Ethan Pineda. MRN: [id number] DOB: February 02, 1951 Today's Date: 05/29/2017    History of Present Illness Pt is a 67 year old male s/p bilateral TKAs    PT Comments    Pt ambulated in hallway and performed safe stair technique.  Pt plans to keep a RW upstairs and downstairs.  Pt has crutches and cane at home as well.  Pt aware to have another person assist with stairs initially for safety. Pt reports living alone however can have friends stay with him the first couple days upon d/c.   Follow Up Recommendations  Follow surgeon's recommendation for DC plan and follow-up therapies     Equipment Recommendations  Rolling walker with 5" wheels    Recommendations for Other Services       Precautions / Restrictions Precautions Precautions: Knee Precaution Comments: verbally reviewed  Restrictions Weight Bearing Restrictions: No Other Position/Activity Restrictions: WBAT Bilateral    Mobility  Bed Mobility               General bed mobility comments: pt up in recliner   Transfers Overall transfer level: Needs assistance Equipment used: Rolling walker (2 wheeled) Transfers: Sit to/from Stand Sit to Stand: Supervision         General transfer comment: verbal cues for technique, close supervision for safety, pt with good upper body strength to self assist  Ambulation/Gait Ambulation/Gait assistance: Min guard;Supervision Ambulation Distance (Feet): 160 Feet(x2) Assistive device: Rolling walker (2 wheeled) Gait Pattern/deviations: Decreased stride length;Wide base of support Gait velocity: decr   General Gait Details: verbal cues for technique, RW positioning, and posture   Stairs Stairs: Yes   Stair Management: One rail Right;Step to pattern;Forwards;With crutches Number of Stairs: 4 General stair comments: educated on step to technique with rail and crutch, pt reports having 2 walkers so he can leave one  upstairs; cues for technique, safety, sequence, performed once with min assist for steadying upon descending however second time only min/guard; pt aware to have another person for safety initially  Wheelchair Mobility    Modified Rankin (Stroke Patients Only)       Balance                                            Cognition Arousal/Alertness: Awake/alert Behavior During Therapy: WFL for tasks assessed/performed Overall Cognitive Status: Within Functional Limits for tasks assessed                                        Exercises Total Joint Exercises Ankle Circles/Pumps: AROM;Both;10 reps Quad Sets: AROM;10 reps;Both Towel Squeeze: AROM;Both;10 reps Short Arc Quad: 10 reps;Both;AROM Heel Slides: AAROM;10 reps;Both;Seated;Supine Hip ABduction/ADduction: 10 reps;Both;AROM Straight Leg Raises: AAROM;10 reps;Both    General Comments        Pertinent Vitals/Pain Pain Assessment: 0-10 Pain Score: 7  Pain Location: bil knees Pain Descriptors / Indicators: Sore;Aching Pain Intervention(s): Limited activity within patient's tolerance;Repositioned;Monitored during session;Ice applied    Home Living                      Prior Function            PT Goals (current goals can now be found in the care plan section) Progress towards  PT goals: Progressing toward goals    Frequency    7X/week      PT Plan Current plan remains appropriate    Co-evaluation              AM-PAC PT "6 Clicks" Daily Activity  Outcome Measure  Difficulty turning over in bed (including adjusting bedclothes, sheets and blankets)?: A Lot Difficulty moving from lying on back to sitting on the side of the bed? : A Lot Difficulty sitting down on and standing up from a chair with arms (e.g., wheelchair, bedside commode, etc,.)?: A Lot Help needed moving to and from a bed to chair (including a wheelchair)?: A Little Help needed walking in hospital  room?: A Little Help needed climbing 3-5 steps with a railing? : A Little 6 Click Score: 15    End of Session Equipment Utilized During Treatment: Gait belt Activity Tolerance: Patient tolerated treatment well Patient left: in chair;with call bell/phone within reach;with family/visitor present   PT Visit Diagnosis: Other abnormalities of gait and mobility (R26.89)     Time: 1010-1036 PT Time Calculation (min) (ACUTE ONLY): 26 min  Charges:  $Gait Training: 8-22 mins $Therapeutic Exercise: 8-22 mins                    G Codes:       Zenovia Jarred, PT, DPT 05/29/2017 Pager: [phone]  Maida Sale E 05/29/2017, 12:58 PM

## 2017-05-29 NOTE — Progress Notes (Signed)
     Subjective: 2 Days Post-Op Procedure(s) (LRB): TOTAL KNEE BILATERAL (Bilateral)   Seen by Dr. Charlann Boxerlin. Patient reports pain as mild, pain controlled. No events throughout the night.  Progressing well.  Discussed recovery period expectations. Ready to be discharged home.   Objective:   VITALS:   Vitals:   05/28/17 2151 05/29/17 0515  BP: 130/77 124/87  Pulse: 91 78  Resp: 16 16  Temp: 98 F (36.7 C) 98.4 F (36.9 C)  SpO2: 100% 100%    Dorsiflexion/Plantar flexion intact Incision: dressing C/D/I No cellulitis present Compartment soft  LABS Recent Labs    05/28/17 0556 05/29/17 0549  HGB 10.5* 9.1*  HCT 30.5* 25.7*  WBC 12.4* 11.1*  PLT 170 182    Recent Labs    05/28/17 0556 05/29/17 0549  NA 141 139  K 4.2 4.3  BUN 18 21*  CREATININE 1.10 1.07  GLUCOSE 127* 109*     Assessment/Plan: 2 Days Post-Op Procedure(s) (LRB): TOTAL KNEE BILATERAL (Bilateral) Up with therapy Discharge home [facility]. Follow up with OLIN,Hilary Milks D in 2 weeks.  Contact information:  [facility] [address], [address] [place] [id number] [phone]          Anastasio AuerbachMatthew S. Baani Bober   PAC  05/29/2017, 9:04 AM

## 2017-06-05 ENCOUNTER — Emergency Department (HOSPITAL_COMMUNITY)
Admission: EM | Admit: 2017-06-05 | Discharge: 2017-06-06 | Disposition: A | Discharge status: Home / Self Care | Payer: BLUE CROSS/BLUE SHIELD | Attending: Emergency Medicine | Admitting: Emergency Medicine

## 2017-06-05 ENCOUNTER — Encounter (HOSPITAL_COMMUNITY): Payer: Self-pay | Admitting: Family Medicine

## 2017-06-05 ENCOUNTER — Emergency Department (HOSPITAL_COMMUNITY): Payer: BLUE CROSS/BLUE SHIELD

## 2017-06-05 DIAGNOSIS — I10 Essential (primary) hypertension: Secondary | ICD-10-CM | POA: Diagnosis not present

## 2017-06-05 DIAGNOSIS — Z96653 Presence of artificial knee joint, bilateral: Secondary | ICD-10-CM | POA: Diagnosis not present

## 2017-06-05 DIAGNOSIS — M25561 Pain in right knee: Secondary | ICD-10-CM | POA: Diagnosis not present

## 2017-06-05 DIAGNOSIS — Z79899 Other long term (current) drug therapy: Secondary | ICD-10-CM | POA: Diagnosis not present

## 2017-06-05 DIAGNOSIS — Z7901 Long term (current) use of anticoagulants: Secondary | ICD-10-CM | POA: Diagnosis not present

## 2017-06-05 DIAGNOSIS — G8918 Other acute postprocedural pain: Secondary | ICD-10-CM | POA: Insufficient documentation

## 2017-06-05 LAB — BASIC METABOLIC PANEL
Anion gap: 10 (ref 5–15)
BUN: 21 mg/dL — AB (ref 6–20)
CHLORIDE: 103 mmol/L (ref 101–111)
CO2: 23 mmol/L (ref 22–32)
Calcium: 8.8 mg/dL — ABNORMAL LOW (ref 8.9–10.3)
Creatinine, Ser: 1.2 mg/dL (ref 0.61–1.24)
GFR calc Af Amer: >60 mL/min (ref >60)
GFR calc non Af Amer: >60 mL/min (ref >60)
GLUCOSE: 173 mg/dL — AB (ref 65–99)
Potassium: 4.1 mmol/L (ref 3.5–5.1)
Sodium: 136 mmol/L (ref 135–145)

## 2017-06-05 LAB — CBC WITH DIFFERENTIAL/PLATELET
Basophils Absolute: 0 10*3/uL (ref 0.0–0.1)
Basophils Relative: 0 %
EOS PCT: 0 %
Eosinophils Absolute: 0 10*3/uL (ref 0.0–0.7)
HCT: 23.2 % — ABNORMAL LOW (ref 39.0–52.0)
HEMOGLOBIN: 8 g/dL — AB (ref 13.0–17.0)
LYMPHS PCT: 9 %
Lymphs Abs: 1.2 10*3/uL (ref 0.7–4.0)
MCH: 28.2 pg (ref 26.0–34.0)
MCHC: 34.5 g/dL (ref 30.0–36.0)
MCV: 81.7 fL (ref 78.0–100.0)
Monocytes Absolute: 1.1 10*3/uL — ABNORMAL HIGH (ref 0.1–1.0)
Monocytes Relative: 8 %
NEUTROS PCT: 83 %
Neutro Abs: 11.2 10*3/uL — ABNORMAL HIGH (ref 1.7–7.7)
Platelets: 395 10*3/uL (ref 150–400)
RBC: 2.84 MIL/uL — ABNORMAL LOW (ref 4.22–5.81)
RDW: 17.2 % — ABNORMAL HIGH (ref 11.5–15.5)
WBC: 13.5 10*3/uL — ABNORMAL HIGH (ref 4.0–10.5)

## 2017-06-05 MED ORDER — OXYCODONE-ACETAMINOPHEN 5-325 MG PO TABS
1.0000 | ORAL_TABLET | ORAL | Status: AC | PRN
  Administered 2017-06-05 – 2017-06-06 (×3): 1 via ORAL
  Filled 2017-06-05 (×3): qty 1

## 2017-06-05 NOTE — ED Triage Notes (Signed)
Patient reports he had bilateral knee surgery last Monday. Two nights ago, he started experiencing increased pain with with swelling. Patient reports it has become harder to get around. Patient has a follow up appointment with ortho on Monday. A friend who is accompanied, is talking with the ortho office while triaging.

## 2017-06-06 ENCOUNTER — Emergency Department (HOSPITAL_BASED_OUTPATIENT_CLINIC_OR_DEPARTMENT_OTHER)
Admit: 2017-06-06 | Discharge: 2017-06-06 | Disposition: A | Discharge status: Home / Self Care | Payer: BLUE CROSS/BLUE SHIELD | Attending: Emergency Medicine | Admitting: Emergency Medicine

## 2017-06-06 DIAGNOSIS — R609 Edema, unspecified: Secondary | ICD-10-CM | POA: Diagnosis not present

## 2017-06-06 LAB — C-REACTIVE PROTEIN: CRP: 18.8 mg/dL — ABNORMAL HIGH (ref <1.0)

## 2017-06-06 MED ORDER — OXYCODONE HCL 5 MG PO TABS
5.0000 mg | ORAL_TABLET | ORAL | 0 refills | Status: AC | PRN
Start: 2017-06-06 — End: ?

## 2017-06-06 MED ORDER — FUROSEMIDE 20 MG PO TABS: 20.0000 mg | ORAL_TABLET | Freq: Every day | ORAL | 0 refills | Status: AC | PRN

## 2017-06-06 NOTE — ED Notes (Signed)
Discharge instructions reviewed with patient. Patient verbalizes understanding. VSS. Patient stood, pivoted, and sat in wheelchair with standby assistance and walker. Patient wheeled to front entrance and assisted into vehicle.

## 2017-06-06 NOTE — ED Notes (Signed)
Family member has come to nurses desk requesting to speak to the MD about why pt cant walk. It has been explained to family member what the delay is.

## 2017-06-06 NOTE — ED Notes (Signed)
Social worker at bedside.

## 2017-06-06 NOTE — ED Notes (Addendum)
Pt family member comes to the nurses station stating the pt is in pain. RN Angelica ChessmanMandy has been made aware.

## 2017-06-06 NOTE — ED Provider Notes (Addendum)
9:30 AM-the patient was held overnight for DVT study, right leg.  He has had bilateral lower extremity Dopplers negative for DVT.  The provider that saw him yesterday was concerned about possible postoperative infection following knee replacement, 1 week ago.  Exam-right knee tender, swollen, resists flexion and extension secondary to pain.  Surgical wound is approximated without drainage, bleeding or signs of dehiscence.  9:40 AM-requested callback from orthopedics regarding patient status as possible infection, versus inflammatory response.  The call was returned at 10:35 AM.  Orthopedic service will see the patient in the ED to evaluate for infection.  Will keep patient n.p.o. at this time.   Mancel BaleWentz, Alysse Rathe, MD 06/06/17 1037   He was evaluated in the emergency department by orthopedic services, who feel that he is stable for outpatient care.  They did not think that he has an acute knee infection.  They elected to prescribe Lasix for leg swelling, and oxycodone for pain relief.   Mancel BaleWentz, Ernestina Joe, MD 06/06/17 1217   After discharge the patient told the nurse he needed help at home, or to be admitted to a rehab facility.  I initiated consultation with case management and social work.   Mancel BaleWentz, Jazmen Lindenbaum, MD 06/06/17 1227

## 2017-06-06 NOTE — ED Notes (Signed)
Bed: WA06 Expected date:  Expected time:  Means of arrival:  Comments: Ethan Pineda C

## 2017-06-06 NOTE — Progress Notes (Signed)
     Subjective:  S/P bilateral TKAs, presenting to the ER with pain and swelling in the right knee. Patient reports pain as severe and had difficulty bearing weight on the right foot. Has been here all night waiting on ta Doppler to r/o DVTs. Feels that the swelling came on suddenly and that he was doing well prior to the swelling.   Objective:   VITALS:   Vitals:   06/06/17 1030 06/06/17 1100  BP: 123/85 112/79  Pulse: (!) 108   Resp:    Temp:    SpO2: 95%     Dorsiflexion/Plantar flexion intact Incision: no drainage No cellulitis present Compartment soft Pitting edmea in the right foot Mild swelling left knee Moderate swelling right knee    LABS Recent Labs    06/05/17 2310  HGB 8.0*  HCT 23.2*  WBC 13.5*  PLT 395    Recent Labs    06/05/17 2310  NA 136  K 4.1  BUN 21*  CREATININE 1.20  GLUCOSE 173*     Assessment/Plan: S/P bilateral TKAs, presenting to the ER with pain and swelling in the right knee.   I do not feel that he has an infection in the knee. Differential shift in blood work, post-op. Swelling within normal levels. Dopplers were obtained and no DVT observed. Reiterated  with the patient to d/c his methotrexate and plaquenil for 2 week. Discussed the swelling present and stressed the use of ice and elevation of the right leg. Lasix ordered to help reduce his swelling, Rx written. Increased analgesic medications, Rx written. Encouraged the use of iron for help his HGB and TED hose to help with swelling.    Anastasio AuerbachMatthew S. Jahir Halt   PAC  06/06/2017, 12:06 PM

## 2017-06-06 NOTE — Discharge Instructions (Signed)
Continue to follow the directions and treatment recommended by your orthopedic care providers.  They are prescribing Lasix for leg swelling, and oxycodone for knee pain.  Follow-up with them as directed.

## 2017-06-06 NOTE — Progress Notes (Signed)
BLE venous duplex prelim: negative for DVT. Thaddeaus Monica Eunice, RDMS, RVT  

## 2017-06-06 NOTE — ED Provider Notes (Signed)
[facility]-EMERGENCY DEPT Provider Note   CSN: [id number] Arrival date & time: 06/05/17  1339     History   Chief Complaint Chief Complaint  Patient presents with  . Knee Pain    HPI Ethan PatrickRaymond W Sortino Jr. is a [age] y.o. male.  HPI  67 year old male comes in with chief complaint of right-sided knee pain.  Patient is status post bilateral knee replacement 1 week ago.  Patient states that over the past 2 days he has had increased pain and swelling over the right lower extremity.  No associated nausea, vomiting, fevers, chills.  Patient has not seen orthopedist for a follow-up and has not done any dressing changes.  Patient is on Xarelto prophylactically for DVT prevention.  Past Medical History:  Diagnosis Date  . Arthritis   . GERD (gastroesophageal reflux disease)   . Hypertension   . Hypothyroidism     Patient Active Problem List   Diagnosis Date Noted  . 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 Status post bilateral knee replacements 05/27/2017    Past Surgical History:  Procedure Laterality Date  . BICEPS TENDON REPAIR  06/26/2015  . TOTAL KNEE ARTHROPLASTY Bilateral 05/27/2017   Procedure: TOTAL KNEE BILATERAL;  Surgeon: Durene Romanslin, Matthew, MD;  Location: [facility] ORS;  Service: Orthopedics;  Laterality: Bilateral;  2.5 hrs       Home Medications    Prior to Admission medications   Medication Sig Start Date End Date Taking? Authorizing Provider  acetaminophen (TYLENOL) 500 MG tablet Take 2 tablets (1,000 mg total) by mouth every 8 (eight) hours. 05/27/17  Yes Babish, Molli HazardMatthew, PA-C  amLODipine (NORVASC) 2.5 MG tablet Take 2.5 mg by mouth daily.   Yes [provider]  Cholecalciferol (VITAMIN D-3) 5000 units TABS Take 5,000 Units by mouth daily.   Yes [provider]  ferrous sulfate (FERROUSUL) 325 (65 FE) MG tablet Take 1 tablet (325 mg total) by mouth 3 (three) times daily with meals. 05/27/17  Yes Babish, Molli HazardMatthew, PA-C  folic acid (FOLVITE) 1  MG tablet Take 1 mg by mouth daily.   Yes [provider]  levothyroxine (SYNTHROID, LEVOTHROID) 88 MCG tablet Take 88 mcg by mouth daily before breakfast.   Yes [provider]  methocarbamol (ROBAXIN) 500 MG tablet Take 1 tablet (500 mg total) by mouth every 6 (six) hours as needed for muscle spasms. 05/27/17  Yes Lanney GinsBabish, Matthew, PA-C  Omega-3 Fatty Acids (FISH OIL PO) Take 520 mg by mouth daily.   Yes [provider]  oxyCODONE (OXY IR/ROXICODONE) 5 MG immediate release tablet Take 1-2 tablets (5-10 mg total) by mouth every 4 (four) hours as needed for moderate pain or severe pain. 05/27/17  Yes Babish, Molli HazardMatthew, PA-C  pantoprazole (PROTONIX) 40 MG tablet Take 40 mg by mouth daily.   Yes [provider]  polyethylene glycol (MIRALAX / GLYCOLAX) packet Take 17 g by mouth 2 (two) times daily. 05/27/17  Yes Babish, Molli HazardMatthew, PA-C  rivaroxaban (XARELTO) 10 MG TABS tablet Take 1 tablet (10 mg total) by mouth daily for 14 days. 05/30/17 06/13/17 Yes Babish, Molli HazardMatthew, PA-C  vitamin B-12 (CYANOCOBALAMIN) 1000 MCG tablet Take 1,000 mcg by mouth daily.   Yes [provider]  aspirin (ASPIRIN CHILDRENS) 81 MG chewable tablet Chew 1 tablet (81 mg total) by mouth 2 (two) times daily. Start the day after finishing Xarelto. Take for 4 weeks, then resume regular dose. Patient not taking: Reported on 06/05/2017 06/14/17 07/14/17  Lanney GinsBabish, Matthew, PA-C  docusate  sodium (COLACE) 100 MG capsule Take 1 capsule (100 mg total) by mouth 2 (two) times daily. Patient not taking: Reported on 06/05/2017 05/27/17   Lanney Gins, PA-C    Family History History reviewed. No pertinent family history.  Social History Social History   Tobacco Use  . Smoking status: Never Smoker  . Smokeless tobacco: Never Used  Substance Use Topics  . Alcohol use: No    Frequency: Never  . Drug use: No     Allergies   Patient has no known allergies.   Review of Systems Review of Systems    Constitutional: Positive for activity change.  Cardiovascular: Negative for chest pain.  Allergic/Immunologic: Negative for immunocompromised state.  Hematological: Bruises/bleeds easily.     Physical Exam Updated Vital Signs BP 112/70 (BP Location: Right Arm)   Pulse 91   Temp 100 F (37.8 C) (Oral)   Resp 16   Ht 6\' 1"  (1.854 m)   Wt 83 kg (183 lb)   SpO2 99%   BMI 24.14 kg/m   Physical Exam  Constitutional: He is oriented to person, place, and time. He appears well-developed.  HENT:  Head: Atraumatic.  Neck: Neck supple.  Cardiovascular: Normal rate.  Pulmonary/Chest: Effort normal.  Musculoskeletal: He exhibits edema and tenderness.  Right lower extremity has significant edema, with knee effusion.  The incision wound does not appear to be infected, there is no drainage.  There is mild warmth to touch.  Positive pitting edema.  Positive tenderness to palpation over the calf region.  Neurological: He is alert and oriented to person, place, and time.  Skin: Skin is warm.  Nursing note and vitals reviewed.    ED Treatments / Results  Labs (all labs ordered are listed, but only abnormal results are displayed) Labs Reviewed  CBC WITH DIFFERENTIAL/PLATELET - Abnormal; Notable for the following components:      Result Value   WBC 13.5 (*)    RBC 2.84 (*)    Hemoglobin 8.0 (*)    HCT 23.2 (*)    RDW 17.2 (*)    Neutro Abs 11.2 (*)    Monocytes Absolute 1.1 (*)    All other components within normal limits  BASIC METABOLIC PANEL - Abnormal; Notable for the following components:   Glucose, Bld 173 (*)    BUN 21 (*)    Calcium 8.8 (*)    All other components within normal limits    EKG  EKG Interpretation None       Radiology Dg Knee 2 Views Right  Result Date: 06/05/2017 CLINICAL DATA:  Postop right knee pain and swelling. Total knee arthroplasty 05/27/2017, 9 days prior. EXAM: RIGHT KNEE - 1-2 VIEW COMPARISON:  None. FINDINGS: Right knee arthroplasty in  expected alignment. No periprosthetic lucency or fracture. There has been patellar resurfacing. No periosteal reaction or bony destructive change. Large suprapatellar joint effusion without evidence of internal air. Diffuse soft tissue edema and soft tissue fullness anteriorly. IMPRESSION: 1. Right knee arthroplasty in expected alignment. No acute osseous abnormality. 2. Large joint effusion. Diffuse soft tissue edema and soft tissue fullness anteriorly. Electronically Signed   By: Rubye Oaks M.D.   On: 06/05/2017 23:07    Procedures Procedures (including critical care time)  Medications Ordered in ED Medications  oxyCODONE-acetaminophen (PERCOCET/ROXICET) 5-325 MG per tablet 1 tablet (1 tablet Oral Given 06/05/17 2347)     Initial Impression / Assessment and Plan / ED Course  I have reviewed the triage vital signs and the  nursing notes.  Pertinent labs & imaging results that were available during my care of the patient were reviewed by me and considered in my medical decision making (see chart for details).     67 year old male comes in with chief complaint of right lower extremity swelling.  Patient is status post recent bilateral knee replacement, and is on Xarelto prophylactically.    Differential diagnosis includes cellulitis, DVT.  We will get ultrasound DVT in the morning.  If the ultrasound is negative patient will need orthopedic consultation and likely antibiotics.   Final Clinical Impressions(s) / ED Diagnoses   Final diagnoses:  Acute pain of right knee    ED Discharge Orders    None       Derwood Kaplan, MD 06/06/17 0127

## 2017-06-06 NOTE — Care Management Note (Signed)
Case Management Note  Patient Details  Name: Ethan PatrickRaymond W Rone Jr. MRN: [id number] Date of Birth: [birth year]  CM [facility].  CM spoke with pt and family member at bedside.  Explained HH.  Family requesting SNF rehab but also reports pt has not been taking his pain medication, has not been icing his knees, and has stopped moving or doing his PT like he was directed.  CM advised his that his ins co may not approve him to go to SNF rehab and that [facility] better [facility] at this time, discussed reasons.  CM advised that if [facility] are unable to help the pt after several visits they would be able to follow up with the ins company for SNF auth.  Pt and family acknowledged understanding and [facility]  [facility] reports [employer] is not in-network for them in pt's location.  Contacted 2n choice [facility] who reports they can accept pt but that start of services would not be until 06/10/2017.  Contacted [facility] who advised [employer] is no longer in-network for them.  CM contacted Team [facility] who advised they are not in-network for his specific version of [place] [employer] and it would cost the pt approx $3,000 plus %70 for [facility] services.  Contacted [facility] who [place].  CM contacted [facility] again and advised Melody that start of care for Monday would be ok.  She will contact the pt tomorrow.  All information faxed.  No further CM needs noted at this time.  Expected Discharge Date:   06/06/2017               Expected Discharge Plan:  Home w Home Health Services  In-House Referral:  Clinical Social Work  Discharge planning Services  CM Consult  Post Acute Care Choice:  Home Health Choice offered to:  Patient, Spouse  HH Arranged:  RN, PT, OT, Nurse's Aide, Social Work [facility]:   [facility]  Status of Service:  Completed, signed off  Lahoma Constantin, Lynnae Sandhoffngela N, RN 06/06/2017, 2:05 PM

## 2018-04-05 IMAGING — CR DG KNEE 1-2V*R*
2 series · 2 of 2 positions shown · non-contrast
Comparison: None.

CLINICAL DATA: Postop right knee pain and swelling. Total knee
arthroplasty 05/27/2017, 9 days prior.

EXAM:
RIGHT KNEE - 1-2 VIEW

[x knee ap right (1 of 2)]
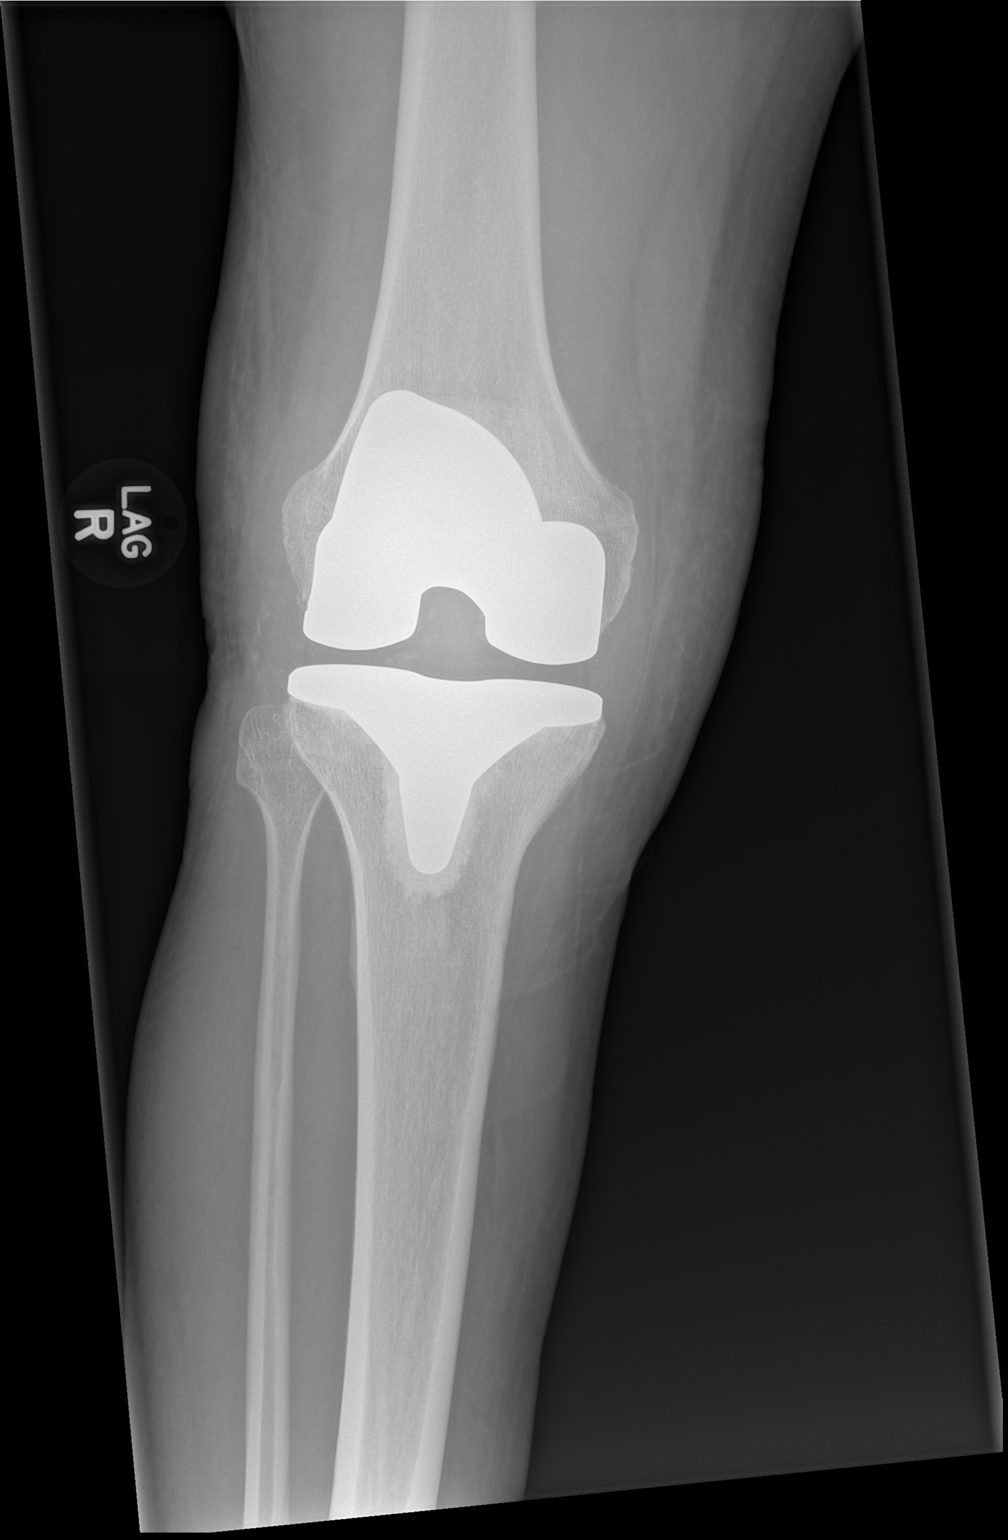

[x knee ap right (2 of 2)]
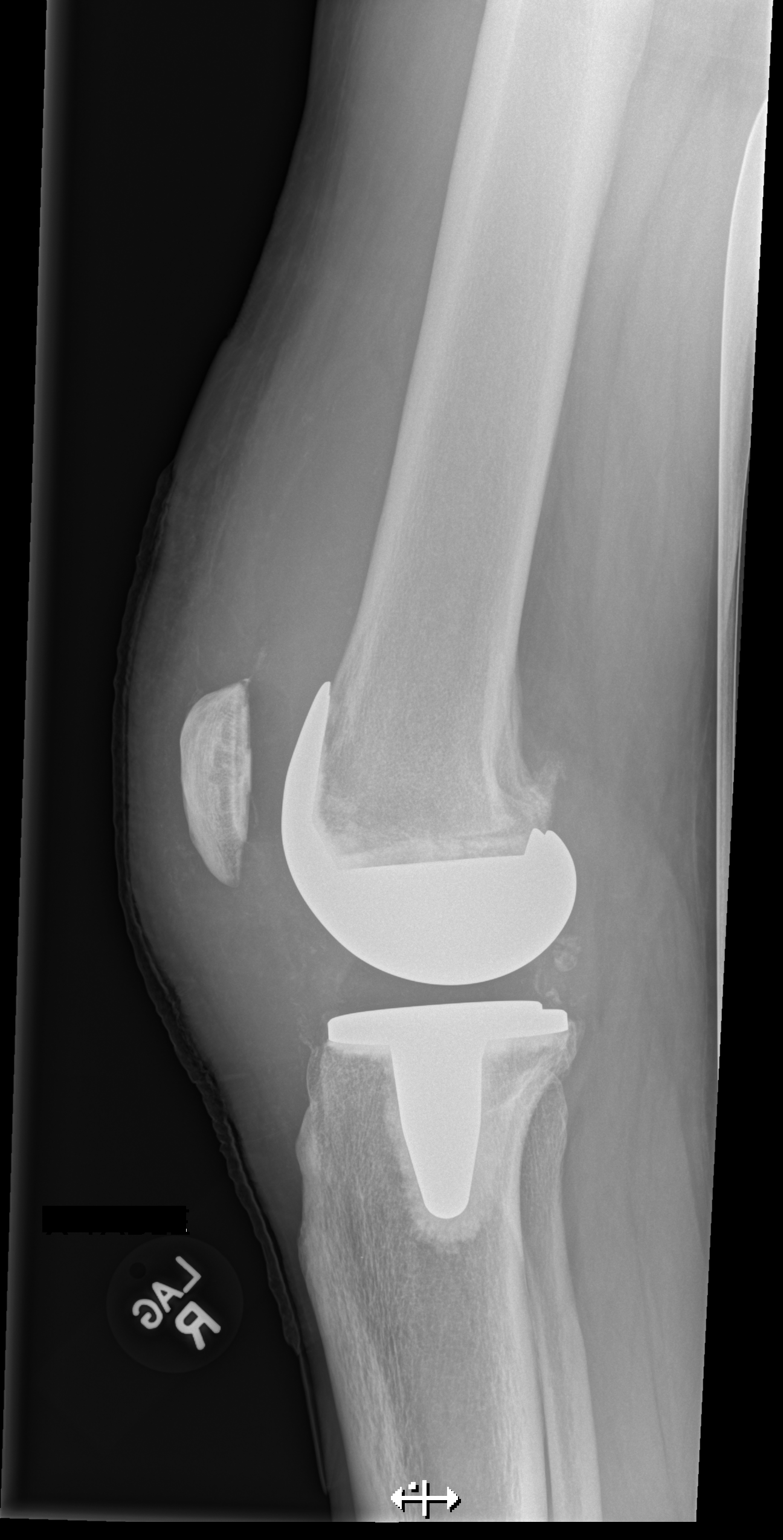

[2 of 2 positions shown; findings below may reference images not displayed]

FINDINGS: Right knee arthroplasty in expected alignment. No periprosthetic
lucency or fracture. There has been patellar resurfacing. No
periosteal reaction or bony destructive change. Large suprapatellar
joint effusion without evidence of internal air. Diffuse soft tissue
edema and soft tissue fullness anteriorly.
IMPRESSION: 1. Right knee arthroplasty in expected alignment. No acute osseous
abnormality.
2. Large joint effusion. Diffuse soft tissue edema and soft tissue
fullness anteriorly.
